# Patient Record
Sex: Male | Born: 1955 | Race: White | Hispanic: No | Marital: Married | Smoking: Never smoker
Health system: Southern US, Community
[De-identification: ages and names within clinical notes are randomized; demographics above are authoritative.]

## PROBLEM LIST (undated history)

## (undated) DIAGNOSIS — E119 Type 2 diabetes mellitus without complications: Secondary | ICD-10-CM

## (undated) DIAGNOSIS — G43909 Migraine, unspecified, not intractable, without status migrainosus: Secondary | ICD-10-CM

## (undated) DIAGNOSIS — D509 Iron deficiency anemia, unspecified: Secondary | ICD-10-CM

## (undated) DIAGNOSIS — E785 Hyperlipidemia, unspecified: Secondary | ICD-10-CM

## (undated) DIAGNOSIS — J45909 Unspecified asthma, uncomplicated: Secondary | ICD-10-CM

## (undated) DIAGNOSIS — Z87442 Personal history of urinary calculi: Secondary | ICD-10-CM

## (undated) DIAGNOSIS — I519 Heart disease, unspecified: Secondary | ICD-10-CM

## (undated) DIAGNOSIS — I10 Essential (primary) hypertension: Secondary | ICD-10-CM

## (undated) DIAGNOSIS — R0600 Dyspnea, unspecified: Secondary | ICD-10-CM

## (undated) DIAGNOSIS — M1990 Unspecified osteoarthritis, unspecified site: Secondary | ICD-10-CM

## (undated) DIAGNOSIS — R0609 Other forms of dyspnea: Secondary | ICD-10-CM

## (undated) DIAGNOSIS — E1142 Type 2 diabetes mellitus with diabetic polyneuropathy: Secondary | ICD-10-CM

## (undated) DIAGNOSIS — N400 Enlarged prostate without lower urinary tract symptoms: Secondary | ICD-10-CM

## (undated) DIAGNOSIS — E669 Obesity, unspecified: Secondary | ICD-10-CM

## (undated) DIAGNOSIS — K219 Gastro-esophageal reflux disease without esophagitis: Secondary | ICD-10-CM

## (undated) DIAGNOSIS — R609 Edema, unspecified: Secondary | ICD-10-CM

## (undated) DIAGNOSIS — G4733 Obstructive sleep apnea (adult) (pediatric): Secondary | ICD-10-CM

## (undated) HISTORY — PX: TOTAL HIP ARTHROPLASTY: SHX124

## (undated) HISTORY — PX: UPPER GI ENDOSCOPY: SHX6162

## (undated) HISTORY — PX: COLONOSCOPY: SHX174

## (undated) HISTORY — PX: TONSILLECTOMY AND ADENOIDECTOMY: SUR1326

## (undated) HISTORY — PX: TOTAL HIP REVISION: SHX763

---

## 1999-09-04 ENCOUNTER — Ambulatory Visit: Admission: RE | Admit: 1999-09-04 | Discharge: 1999-09-04 | Payer: Self-pay | Admitting: Family Medicine

## 1999-11-02 ENCOUNTER — Ambulatory Visit (HOSPITAL_BASED_OUTPATIENT_CLINIC_OR_DEPARTMENT_OTHER): Admission: RE | Admit: 1999-11-02 | Discharge: 1999-11-02 | Payer: Self-pay | Admitting: Family Medicine

## 2016-09-30 ENCOUNTER — Other Ambulatory Visit: Payer: Self-pay | Admitting: Urology

## 2016-10-11 ENCOUNTER — Encounter (HOSPITAL_COMMUNITY): Admission: RE | Payer: Self-pay | Source: Ambulatory Visit

## 2016-10-11 ENCOUNTER — Ambulatory Visit (HOSPITAL_COMMUNITY): Admission: RE | Admit: 2016-10-11 | Payer: 59 | Source: Ambulatory Visit | Admitting: Urology

## 2016-10-11 SURGERY — LITHOTRIPSY, ESWL
Anesthesia: LOCAL | Laterality: Right

## 2018-12-18 ENCOUNTER — Other Ambulatory Visit: Payer: Self-pay | Admitting: Orthopedic Surgery

## 2018-12-18 DIAGNOSIS — M25551 Pain in right hip: Secondary | ICD-10-CM

## 2018-12-28 ENCOUNTER — Other Ambulatory Visit: Payer: Self-pay

## 2018-12-28 ENCOUNTER — Ambulatory Visit
Admission: RE | Admit: 2018-12-28 | Discharge: 2018-12-28 | Disposition: A | Discharge status: Home / Self Care | Payer: 59 | Source: Ambulatory Visit | Attending: Orthopedic Surgery | Admitting: Orthopedic Surgery

## 2018-12-28 DIAGNOSIS — M25551 Pain in right hip: Secondary | ICD-10-CM

## 2019-04-10 DIAGNOSIS — Z8616 Personal history of COVID-19: Secondary | ICD-10-CM

## 2019-04-10 HISTORY — DX: Personal history of COVID-19: Z86.16

## 2019-06-07 ENCOUNTER — Inpatient Hospital Stay: Admit: 2019-06-07 | Payer: 59 | Admitting: Orthopedic Surgery

## 2019-06-07 SURGERY — TOTAL HIP REVISION
Anesthesia: Spinal | Site: Hip | Laterality: Right

## 2019-06-20 ENCOUNTER — Encounter (HOSPITAL_COMMUNITY): Payer: Self-pay

## 2019-06-20 NOTE — Patient Instructions (Addendum)
DUE TO COVID-19 ONLY ONE VISITOR IS ALLOWED TO COME WITH YOU AND STAY IN THE WAITING ROOM ONLY DURING PRE OP AND PROCEDURE. THE ONE VISITOR MAY VISIT WITH YOU IN YOUR PRIVATE ROOM DURING VISITING HOURS ONLY!!       Your procedure is scheduled on: Thursday, Feb. 25, 2021   Report to [facility] Main  Entrance   Report to Short Stay at 5:30 AM   Call this number if you have problems the morning of surgery [phone]   Bring CPAP mask and tubing day of surgery   Do not eat food :After Midnight.   May have liquids until 4:30 AM day of surgery   CLEAR LIQUID DIET  Foods Allowed                                                                     Foods Excluded  Water, Black Coffee and tea, regular and decaf                             liquids that you cannot  Plain Jell-O in any flavor  (No red)                                           see through such as: Fruit ices (not with fruit pulp)                                     milk, soups, orange juice  Iced Popsicles (No red)                                    All solid food Carbonated beverages, regular and diet                                    Apple juices Sports drinks like Gatorade (No red) Lightly seasoned clear broth or consume(fat free) Sugar, honey syrup  Sample Menu Breakfast                                Lunch                                     Supper Cranberry juice                    Beef broth                            Chicken broth Jell-O                                     Grape  juice                           Apple juice Coffee or tea                        Jell-O                                      Popsicle                                                Coffee or tea                        Coffee or tea   Complete one G2 drink the morning of surgery at 4:30AM the day of surgery.   Oral Hygiene is also important to reduce your risk of infection.                                    Remember - BRUSH  YOUR TEETH THE MORNING OF SURGERY WITH YOUR REGULAR TOOTHPASTE   Do NOT smoke after Midnight   DO NOT TAKE JARDIANCE THE DAY BEFORE SURGERY OR THE MORNING OF SURGERY    Take these medicines the morning of surgery with A SIP OF WATER: Carvedilol, Omeprazole  DO NOT TAKE ANY DIABETIC MEDICATIONS DAY OF YOUR SURGERY                      You may not have any metal on your body including jewelry, and body piercings             Do not wear lotions, powders, perfumes/cologne, or deodorant                         Men may shave face and neck.   Do not bring valuables to the hospital. [facility] IS NOT             RESPONSIBLE   FOR VALUABLES.   Contacts, dentures or bridgework may not be worn into surgery.   Bring small overnight bag day of surgery.    Special Instructions: Bring a copy of your healthcare power of attorney and living will documents         the day of surgery if you haven't scanned them in before.              Please read over the following fact sheets you were given:  How to Manage Your Diabetes Before and After Surgery  Why is it important to control my blood sugar before and after surgery? . Improving blood sugar levels before and after surgery helps healing and can limit problems. . A way of improving blood sugar control is eating a healthy diet by: o  Eating less sugar and carbohydrates o  Increasing activity/exercise o  Talking with your doctor about reaching your blood sugar goals . High blood sugars (greater than 180 mg/dL) can raise your risk of infections and slow your recovery, so you will need to focus on controlling your diabetes during the  weeks before surgery. . Make sure that the doctor who takes care of your diabetes knows about your planned surgery including the date and location.  How do I manage my blood sugar before surgery? . Check your blood sugar at least 4 times a day, starting 2 days before surgery, to make sure that the level is not too high or  low. o Check your blood sugar the morning of your surgery when you wake up and every 2 hours until you get to the Short Stay unit. . If your blood sugar is less than 70 mg/dL, you will need to treat for low blood sugar: o Do not take insulin. o Treat a low blood sugar (less than 70 mg/dL) with  cup of clear juice (cranberry or apple), 4 glucose tablets, OR glucose gel. o Recheck blood sugar in 15 minutes after treatment (to make sure it is greater than 70 mg/dL). If your blood sugar is not greater than 70 mg/dL on recheck, call [phone] for further instructions. . Report your blood sugar to the short stay nurse when you get to Short Stay.  . If you are admitted to the hospital after surgery: o Your blood sugar will be checked by the staff and you will probably be given insulin after surgery (instead of oral diabetes medicines) to make sure you have good blood sugar levels. o The goal for blood sugar control after surgery is 80-180 mg/dL.   WHAT DO I DO ABOUT MY DIABETES MEDICATION?   THE DAY BEFORE SURGERY DO NOT TAKE JARDIANCE  . Do not take oral diabetes medicines (pills) the morning of surgery.    Reviewed and Endorsed by [facility] Patient Education Committee, August 2015  [facility] - Preparing for Surgery Before surgery, you can play an important role.  Because skin is not sterile, your skin needs to be as free of germs as possible.  You can reduce the number of germs on your skin by washing with CHG (chlorahexidine gluconate) soap before surgery.  CHG is an antiseptic cleaner which kills germs and bonds with the skin to continue killing germs even after washing. Please DO NOT use if you have an allergy to CHG or antibacterial soaps.  If your skin becomes reddened/irritated stop using the CHG and inform your nurse when you arrive at Short Stay. Do not shave (including legs and underarms) for at least 48 hours prior to the first CHG shower.  You may shave your  face/neck.  Please follow these instructions carefully:  1.  Shower with CHG Soap the night before surgery and the  morning of surgery.  2.  If you choose to wash your hair, wash your hair first as usual with your normal  shampoo.  3.  After you shampoo, rinse your hair and body thoroughly to remove the shampoo.                             4.  Use CHG as you would any other liquid soap.  You can apply chg directly to the skin and wash.  Gently with a scrungie or clean washcloth.  5.  Apply the CHG Soap to your body ONLY FROM THE NECK DOWN.   Do   not use on face/ open                           Wound or open sores. Avoid contact  with eyes, ears mouth and   genitals (private parts).                       Wash face,  Genitals (private parts) with your normal soap.             6.  Wash thoroughly, paying special attention to the area where your    surgery  will be performed.  7.  Thoroughly rinse your body with warm water from the neck down.  8.  DO NOT shower/wash with your normal soap after using and rinsing off the CHG Soap.                9.  Pat yourself dry with a clean towel.            10.  Wear clean pajamas.            11.  Place clean sheets on your bed the night of your first shower and do not  sleep with pets. Day of Surgery : Do not apply any lotions/deodorants the morning of surgery.  Please wear clean clothes to the hospital/surgery center.  FAILURE TO FOLLOW THESE INSTRUCTIONS MAY RESULT IN THE CANCELLATION OF YOUR SURGERY  PATIENT SIGNATURE_________________________________  NURSE SIGNATURE__________________________________  ________________________________________________________________________   Rogelia MireIncentive Spirometer  An incentive spirometer is a tool that can help keep your lungs clear and active. This tool measures how well you are filling your lungs with each breath. Taking long deep breaths may help reverse or decrease the chance of developing breathing (pulmonary)  problems (especially infection) following:  A long period of time when you are unable to move or be active. BEFORE THE PROCEDURE   If the spirometer includes an indicator to show your best effort, your nurse or respiratory therapist will set it to a desired goal.  If possible, sit up straight or lean slightly forward. Try not to slouch.  Hold the incentive spirometer in an upright position. INSTRUCTIONS FOR USE  1. Sit on the edge of your bed if possible, or sit up as far as you can in bed or on a chair. 2. Hold the incentive spirometer in an upright position. 3. Breathe out normally. 4. Place the mouthpiece in your mouth and seal your lips tightly around it. 5. Breathe in slowly and as deeply as possible, raising the piston or the ball toward the top of the column. 6. Hold your breath for 3-5 seconds or for as long as possible. Allow the piston or ball to fall to the bottom of the column. 7. Remove the mouthpiece from your mouth and breathe out normally. 8. Rest for a few seconds and repeat Steps 1 through 7 at least 10 times every 1-2 hours when you are awake. Take your time and take a few normal breaths between deep breaths. 9. The spirometer may include an indicator to show your best effort. Use the indicator as a goal to work toward during each repetition. 10. After each set of 10 deep breaths, practice coughing to be sure your lungs are clear. If you have an incision (the cut made at the time of surgery), support your incision when coughing by placing a pillow or rolled up towels firmly against it. Once you are able to get out of bed, walk around indoors and cough well. You may stop using the incentive spirometer when instructed by your caregiver.  RISKS AND COMPLICATIONS  Take your time so you do not get dizzy  or light-headed.  If you are in pain, you may need to take or ask for pain medication before doing incentive spirometry. It is harder to take a deep breath if you are having  pain. AFTER USE  Rest and breathe slowly and easily.  It can be helpful to keep track of a log of your progress. Your caregiver can provide you with a simple table to help with this. If you are using the spirometer at home, follow these instructions: SEEK MEDICAL CARE IF:   You are having difficultly using the spirometer.  You have trouble using the spirometer as often as instructed.  Your pain medication is not giving enough relief while using the spirometer.  You develop fever of 100.5 F (38.1 C) or higher. SEEK IMMEDIATE MEDICAL CARE IF:   You cough up bloody sputum that had not been present before.  You develop fever of 102 F (38.9 C) or greater.  You develop worsening pain at or near the incision site. MAKE SURE YOU:   Understand these instructions.  Will watch your condition.  Will get help right away if you are not doing well or get worse. Document Released: 09/06/2006 Document Revised: 07/19/2011 Document Reviewed: 11/07/2006 Exit[employer] Patient Information 2014 ExitCare, [place].   ________________________________________________________________________  WHAT IS A BLOOD TRANSFUSION? Blood Transfusion Information  A transfusion is the replacement of blood or some of its parts. Blood is made up of multiple cells which provide different functions.  Red blood cells carry oxygen and are used for blood loss replacement.  White blood cells fight against infection.  Platelets control bleeding.  Plasma helps clot blood.  Other blood products are available for specialized needs, such as hemophilia or other clotting disorders. BEFORE THE TRANSFUSION  Who gives blood for transfusions?   Healthy volunteers who are fully evaluated to make sure their blood is safe. This is blood bank blood. Transfusion therapy is the safest it has ever been in the practice of medicine. Before blood is taken from a donor, a complete history is taken to make sure that person has no history  of diseases nor engages in risky social behavior (examples are intravenous drug use or sexual activity with multiple partners). The donor's travel history is screened to minimize risk of transmitting infections, such as malaria. The donated blood is tested for signs of infectious diseases, such as HIV and hepatitis. The blood is then tested to be sure it is compatible with you in order to minimize the chance of a transfusion reaction. If you or a relative donates blood, this is often done in anticipation of surgery and is not appropriate for emergency situations. It takes many days to process the donated blood. RISKS AND COMPLICATIONS Although transfusion therapy is very safe and saves many lives, the main dangers of transfusion include:   Getting an infectious disease.  Developing a transfusion reaction. This is an allergic reaction to something in the blood you were given. Every precaution is taken to prevent this. The decision to have a blood transfusion has been considered carefully by your caregiver before blood is given. Blood is not given unless the benefits outweigh the risks. AFTER THE TRANSFUSION  Right after receiving a blood transfusion, you will usually feel much better and more energetic. This is especially true if your red blood cells have gotten low (anemic). The transfusion raises the level of the red blood cells which carry oxygen, and this usually causes an energy increase.  The nurse administering the transfusion will monitor  you carefully for complications. HOME CARE INSTRUCTIONS  No special instructions are needed after a transfusion. You may find your energy is better. Speak with your caregiver about any limitations on activity for underlying diseases you may have. SEEK MEDICAL CARE IF:   Your condition is not improving after your transfusion.  You develop redness or irritation at the intravenous (IV) site. SEEK IMMEDIATE MEDICAL CARE IF:  Any of the following symptoms  occur over the next 12 hours:  Shaking chills.  You have a temperature by mouth above 102 F (38.9 C), not controlled by medicine.  Chest, back, or muscle pain.  People around you feel you are not acting correctly or are confused.  Shortness of breath or difficulty breathing.  Dizziness and fainting.  You get a rash or develop hives.  You have a decrease in urine output.  Your urine turns a dark color or changes to pink, red, or brown. Any of the following symptoms occur over the next 10 days:  You have a temperature by mouth above 102 F (38.9 C), not controlled by medicine.  Shortness of breath.  Weakness after normal activity.  The white part of the eye turns yellow (jaundice).  You have a decrease in the amount of urine or are urinating less often.  Your urine turns a dark color or changes to pink, red, or brown. Document Released: 04/23/2000 Document Revised: 07/19/2011 Document Reviewed: 12/11/2007 [facility] Patient Information 2014 [place], [place].  _______________________________________________________________________

## 2019-06-25 ENCOUNTER — Encounter (HOSPITAL_COMMUNITY)
Admission: RE | Admit: 2019-06-25 | Discharge: 2019-06-25 | Disposition: A | Discharge status: Home / Self Care | Payer: BC Managed Care – PPO | Source: Ambulatory Visit | Attending: Orthopedic Surgery | Admitting: Orthopedic Surgery

## 2019-06-25 ENCOUNTER — Other Ambulatory Visit: Payer: Self-pay

## 2019-06-25 ENCOUNTER — Encounter (HOSPITAL_COMMUNITY): Payer: Self-pay

## 2019-06-25 HISTORY — DX: Other forms of dyspnea: R06.09

## 2019-06-25 HISTORY — DX: Unspecified osteoarthritis, unspecified site: M19.90

## 2019-06-25 HISTORY — DX: Heart disease, unspecified: I51.9

## 2019-06-25 HISTORY — DX: Iron deficiency anemia, unspecified: D50.9

## 2019-06-25 HISTORY — DX: Dyspnea, unspecified: R06.00

## 2019-06-25 HISTORY — DX: Gastro-esophageal reflux disease without esophagitis: K21.9

## 2019-06-25 HISTORY — DX: Migraine, unspecified, not intractable, without status migrainosus: G43.909

## 2019-06-25 HISTORY — DX: Type 2 diabetes mellitus with diabetic polyneuropathy: E11.42

## 2019-06-25 HISTORY — DX: Benign prostatic hyperplasia without lower urinary tract symptoms: N40.0

## 2019-06-25 HISTORY — DX: Edema, unspecified: R60.9

## 2019-06-25 HISTORY — DX: Essential (primary) hypertension: I10

## 2019-06-25 HISTORY — DX: Unspecified asthma, uncomplicated: J45.909

## 2019-06-25 HISTORY — DX: Type 2 diabetes mellitus without complications: E11.9

## 2019-06-25 HISTORY — DX: Obesity, unspecified: E66.9

## 2019-06-25 HISTORY — DX: Personal history of urinary calculi: Z87.442

## 2019-06-25 HISTORY — DX: Hyperlipidemia, unspecified: E78.5

## 2019-06-25 HISTORY — DX: Obstructive sleep apnea (adult) (pediatric): G47.33

## 2019-06-25 NOTE — Progress Notes (Signed)
PCP - Dr. Martha Clan last office visit 06/06/19 Cardiologist - Dr. Cammy Brochure 12/20  Chest x-ray - greater than 1 year EKG - 06/27/19 in epic Stress Test - greater than 2 years ECHO - 12/2017 in epic Cardiac Cath - greater than 2 years  Sleep Study - 2019 CPAP - Yes  Fasting Blood Sugar -  120-140 Checks Blood Sugar __2___ times a week  Blood Thinner Instructions: N/A Aspirin Instructions: Yes Last Dose: Patient stated he will stop 5 days prior to surgery  Anesthesia review: DM, Right heart ventricular dysfunction  Patient denies shortness of breath, fever, cough and chest pain at PAT appointment   Patient verbalized understanding of instructions that were given to them at the PAT appointment. Patient was also instructed that they will need to review over the PAT instructions again at home before surgery.

## 2019-06-27 ENCOUNTER — Other Ambulatory Visit: Payer: Self-pay

## 2019-06-27 ENCOUNTER — Encounter (HOSPITAL_COMMUNITY)
Admission: RE | Admit: 2019-06-27 | Discharge: 2019-06-27 | Disposition: A | Discharge status: Home / Self Care | Payer: BC Managed Care – PPO | Source: Ambulatory Visit | Attending: Orthopedic Surgery | Admitting: Orthopedic Surgery

## 2019-06-27 DIAGNOSIS — Z01818 Encounter for other preprocedural examination: Secondary | ICD-10-CM | POA: Insufficient documentation

## 2019-06-27 LAB — COMPREHENSIVE METABOLIC PANEL
ALT: 24 U/L (ref 0–44)
AST: 24 U/L (ref 15–41)
Albumin: 3.8 g/dL (ref 3.5–5.0)
Alkaline Phosphatase: 84 U/L (ref 38–126)
Anion gap: 11 (ref 5–15)
BUN: 16 mg/dL (ref 8–23)
CO2: 25 mmol/L (ref 22–32)
Calcium: 9.5 mg/dL (ref 8.9–10.3)
Chloride: 105 mmol/L (ref 98–111)
Creatinine, Ser: 1.06 mg/dL (ref 0.61–1.24)
GFR calc Af Amer: >60 mL/min (ref >60)
GFR calc non Af Amer: >60 mL/min (ref >60)
Glucose, Bld: 111 mg/dL — ABNORMAL HIGH (ref 70–99)
Potassium: 4.1 mmol/L (ref 3.5–5.1)
Sodium: 141 mmol/L (ref 135–145)
Total Bilirubin: 0.7 mg/dL (ref 0.3–1.2)
Total Protein: 7.2 g/dL (ref 6.5–8.1)

## 2019-06-27 LAB — CBC
HCT: 45.5 % (ref 39.0–52.0)
Hemoglobin: 14.7 g/dL (ref 13.0–17.0)
MCH: 28.9 pg (ref 26.0–34.0)
MCHC: 32.3 g/dL (ref 30.0–36.0)
MCV: 89.6 fL (ref 80.0–100.0)
Platelets: 183 10*3/uL (ref 150–400)
RBC: 5.08 MIL/uL (ref 4.22–5.81)
RDW: 14.9 % (ref 11.5–15.5)
WBC: 5.6 10*3/uL (ref 4.0–10.5)
nRBC: 0 % (ref 0.0–0.2)

## 2019-06-27 LAB — GLUCOSE, CAPILLARY: Glucose-Capillary: 110 mg/dL — ABNORMAL HIGH (ref 70–99)

## 2019-06-27 LAB — PROTIME-INR
INR: 1 (ref 0.8–1.2)
Prothrombin Time: 13 seconds (ref 11.4–15.2)

## 2019-06-27 LAB — ABO/RH: ABO/RH(D): O POS

## 2019-06-27 LAB — HEMOGLOBIN A1C
Hgb A1c MFr Bld: 6.6 % — ABNORMAL HIGH (ref 4.8–5.6)
Mean Plasma Glucose: 142.72 mg/dL

## 2019-06-28 NOTE — Progress Notes (Addendum)
Anesthesia Chart Review   Case: [id number] Date/Time: 07/05/19 0715   Procedure: TOTAL HIP REVISION (Right Hip) - 90 mins   Anesthesia type: Spinal   Pre-op diagnosis: Failed right total hip   Location: [facility] ROOM 09 / [facility]   Surgeons: Durene Romans, MD      DISCUSSION:63 y.o. 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 Durene Romans.   Pt last seen by cardiologist, 04/13/2019.  Per OV note, "HTN well controlled.  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  Repeat TTE 12/2017 showed normalization and RV function.  Continue to monitor for s/sx of HF.  HL well controlled with statin." 1 year follow up recommended.    Anticipate pt can proceed with planned procedure barring acute status change.   VS: BP 119/82 (BP Location: Right Arm)   Pulse 63   Temp 36.8 C (Oral)   Resp 18   Ht 5\' 11"  (1.803 m)   Wt 96.9 kg   SpO2 97%   BMI 29.79 kg/m   PROVIDERS: Patient, No Pcp Per  , MD is PCP last seen 06/06/19, stable at this visit.   Pu, 06/08/19, MD is Cardiologist  LABS: Labs reviewed: Acceptable for surgery. (all labs ordered are listed, but only abnormal results are displayed)  Labs Reviewed  COMPREHENSIVE METABOLIC PANEL - Abnormal; Notable for the following components:      Result Value   Glucose, Bld 111 (*)    All other components within normal limits  HEMOGLOBIN A1C - Abnormal; Notable for the following components:   Hgb A1c MFr Bld 6.6 (*)    All other components within normal limits  GLUCOSE, CAPILLARY - Abnormal; Notable for the following components:   Glucose-Capillary 110 (*)    All other components within normal limits  SURGICAL PCR SCREEN  CBC  PROTIME-INR  TYPE AND SCREEN  ABO/RH     IMAGES:   EKG: 06/27/19 Rate 59 bpm Sinus bradycardia Left axis deviation Possible Anterior infarct , age  undetermined Abnormal ECG No previous tracing  CV: 2D Limited Echo 12/08/17 SUMMARY Limited study The right ventricle is mild to moderately dilated. The right ventricular systolic function is normal. The right atrium is mildly dilated. There was insufficient TR detected to calculate RV systolic pressure. Estimated right atrial pressure is 5 mmHg.02/07/18 There is no pericardial effusion.  Echo 08/16/2017 Left Ventricular EF 60 %   SUMMARY Ammended report to reflect RV dysfunction. The left ventricular size is normal. Mild left ventricular hypertrophy  Left ventricular systolic function is normal. No segmental wall motion abnormalities seen in the left ventricle The right ventricle is moderately to severely dilated with moderate global  hypokinesis and with trabeculation. The left atrium is borderline dilated.  Right atrial size is normal. There is aortic valve sclerosis. Borderline aortic root dilatation. There is no pericardial effusion. In comparison to images of exam from 06/24/11 RV dilation and dysfunction have  worsened significantly. Past Medical History:  Diagnosis Date  . Arthritis   . Asthma   . BPH (benign prostatic hyperplasia)    not currently having issues  . Diabetes mellitus without complication (HCC)   . DM polyneuropathy (HCC)   . DOE (dyspnea on exertion)    Chronic  . Edema   . GERD (gastroesophageal reflux disease)   . History of COVID-19 04/2019   positive results noted in care  everywhere  . History of kidney stones   . Hyperlipidemia   . Hypertension   . IDA (iron deficiency anemia)   . Microcytic hypochromic anemia   . Migraine   . Obese   . OSA on CPAP   . Right ventricular dysfunction    Idiopathic RV systolic dysfunction, right side enlargement    Past Surgical History:  Procedure Laterality Date  . COLONOSCOPY    . TONSILLECTOMY AND ADENOIDECTOMY    . TOTAL HIP ARTHROPLASTY Right   . TOTAL HIP REVISION Right   . UPPER GI ENDOSCOPY       MEDICATIONS: . Alpha-Lipoic Acid 200 MG CAPS  . aspirin EC 81 MG tablet  . atorvastatin (LIPITOR) 20 MG tablet  . Boswellia Serrata (BOSWELLIA PO)  . carvedilol (COREG) 6.25 MG tablet  . empagliflozin (JARDIANCE) 10 MG TABS tablet  . ferrous sulfate 325 (65 FE) MG tablet  . folic acid (FOLVITE) 983 MCG tablet  . furosemide (LASIX) 20 MG tablet  . Galcanezumab-gnlm (EMGALITY) 120 MG/ML SOAJ  . metFORMIN (GLUCOPHAGE) 1000 MG tablet  . montelukast (SINGULAIR) 10 MG tablet  . Multiple Vitamin (MULTIVITAMIN WITH MINERALS) TABS tablet  . omeprazole (PRILOSEC) 20 MG capsule  . valsartan (DIOVAN) 320 MG tablet   No current facility-administered medications for this encounter.   Maia Plan [facility] Pre-Surgical Testing [phone] 06/28/19  11:50 AM

## 2019-07-02 ENCOUNTER — Inpatient Hospital Stay (HOSPITAL_COMMUNITY): Admission: RE | Admit: 2019-07-02 | Payer: BC Managed Care – PPO | Source: Ambulatory Visit

## 2019-07-02 NOTE — Progress Notes (Signed)
Patient was positive for COVID on 05/07/19, his results are with in the 90 day window of no retesting required.  Patients results are in care everywhere

## 2019-07-04 NOTE — Anesthesia Preprocedure Evaluation (Addendum)
Anesthesia Evaluation    Reviewed: Allergy & Precautions, Patient's Chart, lab work & pertinent test results  History of Anesthesia Complications Negative for: history of anesthetic complications  Airway Mallampati: II  TM Distance: >3 FB Neck ROM: Full    Dental  (+) Teeth Intact, Dental Advisory Given   Pulmonary asthma , sleep apnea and Continuous Positive Airway Pressure Ventilation ,    Pulmonary exam normal breath sounds clear to auscultation       Cardiovascular hypertension, Pt. on home beta blockers and Pt. on medications + DOE  Normal cardiovascular exam Rhythm:Regular Rate:Normal     Neuro/Psych  Headaches,  Neuromuscular disease negative psych ROS   GI/Hepatic Neg liver ROS, GERD  Medicated and Controlled,  Endo/Other  diabetes, Type 2, Oral Hypoglycemic Agents  Renal/GU negative Renal ROS     Musculoskeletal  (+) Arthritis , Failed right total hip   Abdominal   Peds  Hematology negative hematology ROS (+) Plt 183k   Anesthesia Other Findings Day of surgery medications reviewed with the patient.  Reproductive/Obstetrics                            Anesthesia Physical Anesthesia Plan  ASA: III  Anesthesia Plan: Spinal   Post-op Pain Management:    Induction: Intravenous  PONV Risk Score and Plan: 2 and Propofol infusion, Midazolam and Ondansetron  Airway Management Planned: Natural Airway and Nasal Cannula  Additional Equipment:   Intra-op Plan:   Post-operative Plan:   Informed Consent: I have reviewed the patients History and Physical, chart, labs and discussed the procedure including the risks, benefits and alternatives for the proposed anesthesia with the patient or authorized representative who has indicated his/her understanding and acceptance.     Dental advisory given  Plan Discussed with: CRNA  Anesthesia Plan Comments:        Anesthesia Quick  Evaluation

## 2019-07-05 ENCOUNTER — Inpatient Hospital Stay (HOSPITAL_COMMUNITY): Payer: BC Managed Care – PPO

## 2019-07-05 ENCOUNTER — Inpatient Hospital Stay (HOSPITAL_COMMUNITY)
Admission: RE | Admit: 2019-07-05 | Discharge: 2019-07-06 | DRG: 468 | Disposition: A | Discharge status: Home / Self Care | Payer: BC Managed Care – PPO | Attending: Orthopedic Surgery | Admitting: Orthopedic Surgery

## 2019-07-05 ENCOUNTER — Inpatient Hospital Stay (HOSPITAL_COMMUNITY): Payer: BC Managed Care – PPO | Admitting: Physician Assistant

## 2019-07-05 ENCOUNTER — Other Ambulatory Visit: Payer: Self-pay

## 2019-07-05 ENCOUNTER — Encounter (HOSPITAL_COMMUNITY)
Admission: RE | Disposition: A | Discharge status: Home / Self Care | Payer: Self-pay | Source: Home / Self Care | Attending: Orthopedic Surgery

## 2019-07-05 ENCOUNTER — Inpatient Hospital Stay (HOSPITAL_COMMUNITY): Payer: BC Managed Care – PPO | Admitting: Anesthesiology

## 2019-07-05 ENCOUNTER — Encounter (HOSPITAL_COMMUNITY): Payer: Self-pay | Admitting: Orthopedic Surgery

## 2019-07-05 DIAGNOSIS — Z87442 Personal history of urinary calculi: Secondary | ICD-10-CM

## 2019-07-05 DIAGNOSIS — Z888 Allergy status to other drugs, medicaments and biological substances status: Secondary | ICD-10-CM

## 2019-07-05 DIAGNOSIS — G4733 Obstructive sleep apnea (adult) (pediatric): Secondary | ICD-10-CM | POA: Diagnosis present

## 2019-07-05 DIAGNOSIS — Z79899 Other long term (current) drug therapy: Secondary | ICD-10-CM

## 2019-07-05 DIAGNOSIS — T84060A Wear of articular bearing surface of internal prosthetic right hip joint, initial encounter: Secondary | ICD-10-CM | POA: Diagnosis present

## 2019-07-05 DIAGNOSIS — E1142 Type 2 diabetes mellitus with diabetic polyneuropathy: Secondary | ICD-10-CM | POA: Diagnosis present

## 2019-07-05 DIAGNOSIS — I10 Essential (primary) hypertension: Secondary | ICD-10-CM | POA: Diagnosis present

## 2019-07-05 DIAGNOSIS — E785 Hyperlipidemia, unspecified: Secondary | ICD-10-CM | POA: Diagnosis present

## 2019-07-05 DIAGNOSIS — M25551 Pain in right hip: Secondary | ICD-10-CM | POA: Diagnosis present

## 2019-07-05 DIAGNOSIS — N400 Enlarged prostate without lower urinary tract symptoms: Secondary | ICD-10-CM | POA: Diagnosis present

## 2019-07-05 DIAGNOSIS — M1611 Unilateral primary osteoarthritis, right hip: Secondary | ICD-10-CM | POA: Diagnosis present

## 2019-07-05 DIAGNOSIS — Z7984 Long term (current) use of oral hypoglycemic drugs: Secondary | ICD-10-CM | POA: Diagnosis not present

## 2019-07-05 DIAGNOSIS — T84050A Periprosthetic osteolysis of internal prosthetic right hip joint, initial encounter: Principal | ICD-10-CM | POA: Diagnosis present

## 2019-07-05 DIAGNOSIS — K219 Gastro-esophageal reflux disease without esophagitis: Secondary | ICD-10-CM | POA: Diagnosis present

## 2019-07-05 DIAGNOSIS — Z7982 Long term (current) use of aspirin: Secondary | ICD-10-CM

## 2019-07-05 DIAGNOSIS — J45909 Unspecified asthma, uncomplicated: Secondary | ICD-10-CM | POA: Diagnosis present

## 2019-07-05 DIAGNOSIS — Z8616 Personal history of COVID-19: Secondary | ICD-10-CM

## 2019-07-05 DIAGNOSIS — Y792 Prosthetic and other implants, materials and accessory orthopedic devices associated with adverse incidents: Secondary | ICD-10-CM | POA: Diagnosis present

## 2019-07-05 DIAGNOSIS — E669 Obesity, unspecified: Secondary | ICD-10-CM | POA: Diagnosis present

## 2019-07-05 DIAGNOSIS — Z6830 Body mass index (BMI) 30.0-30.9, adult: Secondary | ICD-10-CM | POA: Diagnosis not present

## 2019-07-05 DIAGNOSIS — Z96649 Presence of unspecified artificial hip joint: Secondary | ICD-10-CM

## 2019-07-05 DIAGNOSIS — Y838 Other surgical procedures as the cause of abnormal reaction of the patient, or of later complication, without mention of misadventure at the time of the procedure: Secondary | ICD-10-CM | POA: Diagnosis present

## 2019-07-05 DIAGNOSIS — E663 Overweight: Secondary | ICD-10-CM | POA: Diagnosis present

## 2019-07-05 HISTORY — PX: TOTAL HIP REVISION: SHX763

## 2019-07-05 LAB — GLUCOSE, CAPILLARY
Glucose-Capillary: 154 mg/dL — ABNORMAL HIGH (ref 70–99)
Glucose-Capillary: 126 mg/dL — ABNORMAL HIGH (ref 70–99)
Glucose-Capillary: 187 mg/dL — ABNORMAL HIGH (ref 70–99)
Glucose-Capillary: 190 mg/dL — ABNORMAL HIGH (ref 70–99)

## 2019-07-05 SURGERY — TOTAL HIP REVISION
Anesthesia: Spinal | Site: Hip | Laterality: Right

## 2019-07-05 MED ORDER — STERILE WATER FOR IRRIGATION IR SOLN
Status: DC | PRN
  Administered 2019-07-05: 2000 mL

## 2019-07-05 MED ORDER — METOCLOPRAMIDE HCL 5 MG/ML IJ SOLN: 5.0000 mg | Freq: Three times a day (TID) | INTRAMUSCULAR | Status: DC | PRN

## 2019-07-05 MED ORDER — LACTATED RINGERS IV SOLN: INTRAVENOUS | Status: DC

## 2019-07-05 MED ORDER — PROMETHAZINE HCL 25 MG/ML IJ SOLN: 6.2500 mg | INTRAMUSCULAR | Status: DC | PRN

## 2019-07-05 MED ORDER — FERROUS SULFATE 325 (65 FE) MG PO TABS
325.0000 mg | ORAL_TABLET | Freq: Three times a day (TID) | ORAL | Status: DC
  Administered 2019-07-05 – 2019-07-06 (×2): 325 mg via ORAL
  Filled 2019-07-05 (×2): qty 1

## 2019-07-05 MED ORDER — CHLORHEXIDINE GLUCONATE 4 % EX LIQD: 60.0000 mL | Freq: Once | CUTANEOUS | Status: DC

## 2019-07-05 MED ORDER — DEXAMETHASONE SODIUM PHOSPHATE 10 MG/ML IJ SOLN
10.0000 mg | Freq: Once | INTRAMUSCULAR | Status: AC
  Administered 2019-07-05: 10 mg via INTRAVENOUS

## 2019-07-05 MED ORDER — PHENYLEPHRINE 40 MCG/ML (10ML) SYRINGE FOR IV PUSH (FOR BLOOD PRESSURE SUPPORT)
PREFILLED_SYRINGE | INTRAVENOUS | Status: AC
  Filled 2019-07-05: qty 10

## 2019-07-05 MED ORDER — ONDANSETRON HCL 4 MG PO TABS: 4.0000 mg | ORAL_TABLET | Freq: Four times a day (QID) | ORAL | Status: DC | PRN

## 2019-07-05 MED ORDER — ONDANSETRON HCL 4 MG/2ML IJ SOLN
INTRAMUSCULAR | Status: AC
  Filled 2019-07-05: qty 2

## 2019-07-05 MED ORDER — METHOCARBAMOL 500 MG PO TABS
500.0000 mg | ORAL_TABLET | Freq: Four times a day (QID) | ORAL | Status: DC | PRN
  Administered 2019-07-06: 09:00:00 500 mg via ORAL
  Filled 2019-07-05 (×2): qty 1

## 2019-07-05 MED ORDER — TRANEXAMIC ACID-NACL 1000-0.7 MG/100ML-% IV SOLN
1000.0000 mg | INTRAVENOUS | Status: AC
  Administered 2019-07-05: 1000 mg via INTRAVENOUS
  Filled 2019-07-05: qty 100

## 2019-07-05 MED ORDER — POVIDONE-IODINE 10 % EX SWAB
2.0000 "application " | Freq: Once | CUTANEOUS | Status: AC
  Administered 2019-07-05: 2 via TOPICAL

## 2019-07-05 MED ORDER — SODIUM CHLORIDE 0.9 % IR SOLN
Status: DC | PRN
  Administered 2019-07-05: 1000 mL

## 2019-07-05 MED ORDER — IRBESARTAN 150 MG PO TABS
300.0000 mg | ORAL_TABLET | Freq: Every day | ORAL | Status: DC
  Administered 2019-07-05 – 2019-07-06 (×2): 300 mg via ORAL
  Filled 2019-07-05 (×2): qty 2

## 2019-07-05 MED ORDER — MIDAZOLAM HCL 2 MG/2ML IJ SOLN
INTRAMUSCULAR | Status: DC | PRN
  Administered 2019-07-05: 2 mg via INTRAVENOUS

## 2019-07-05 MED ORDER — POLYETHYLENE GLYCOL 3350 17 G PO PACK
17.0000 g | PACK | Freq: Two times a day (BID) | ORAL | Status: DC
  Administered 2019-07-05: 21:00:00 17 g via ORAL
  Filled 2019-07-05 (×2): qty 1

## 2019-07-05 MED ORDER — DEXAMETHASONE SODIUM PHOSPHATE 10 MG/ML IJ SOLN
INTRAMUSCULAR | Status: AC
  Filled 2019-07-05: qty 1

## 2019-07-05 MED ORDER — METFORMIN HCL 500 MG PO TABS
1000.0000 mg | ORAL_TABLET | Freq: Two times a day (BID) | ORAL | Status: DC
  Administered 2019-07-05 – 2019-07-06 (×2): 1000 mg via ORAL
  Filled 2019-07-05 (×2): qty 2

## 2019-07-05 MED ORDER — NON FORMULARY: 20.0000 mg | Freq: Two times a day (BID) | Status: DC

## 2019-07-05 MED ORDER — MAGNESIUM CITRATE PO SOLN: 1.0000 | Freq: Once | ORAL | Status: DC | PRN

## 2019-07-05 MED ORDER — INSULIN ASPART 100 UNIT/ML ~~LOC~~ SOLN
0.0000 [IU] | Freq: Three times a day (TID) | SUBCUTANEOUS | Status: DC
  Administered 2019-07-05: 3 [IU] via SUBCUTANEOUS

## 2019-07-05 MED ORDER — BISACODYL 10 MG RE SUPP: 10.0000 mg | Freq: Every day | RECTAL | Status: DC | PRN

## 2019-07-05 MED ORDER — METHOCARBAMOL 500 MG IVPB - SIMPLE MED
INTRAVENOUS | Status: AC
  Filled 2019-07-05: qty 50

## 2019-07-05 MED ORDER — ATORVASTATIN CALCIUM 20 MG PO TABS
20.0000 mg | ORAL_TABLET | Freq: Every day | ORAL | Status: DC
  Administered 2019-07-05 – 2019-07-06 (×2): 20 mg via ORAL
  Filled 2019-07-05 (×2): qty 1

## 2019-07-05 MED ORDER — DIPHENHYDRAMINE HCL 12.5 MG/5ML PO ELIX: 12.5000 mg | ORAL_SOLUTION | ORAL | Status: DC | PRN

## 2019-07-05 MED ORDER — SODIUM CHLORIDE 0.9 % IV SOLN: INTRAVENOUS | Status: DC

## 2019-07-05 MED ORDER — ONDANSETRON HCL 4 MG/2ML IJ SOLN
INTRAMUSCULAR | Status: DC | PRN
  Administered 2019-07-05: 4 mg via INTRAVENOUS

## 2019-07-05 MED ORDER — HYDROCODONE-ACETAMINOPHEN 7.5-325 MG PO TABS: 1.0000 | ORAL_TABLET | ORAL | Status: DC | PRN

## 2019-07-05 MED ORDER — PHENYLEPHRINE 40 MCG/ML (10ML) SYRINGE FOR IV PUSH (FOR BLOOD PRESSURE SUPPORT)
PREFILLED_SYRINGE | INTRAVENOUS | Status: DC | PRN
  Administered 2019-07-05: 80 ug via INTRAVENOUS
  Administered 2019-07-05 (×2): 120 ug via INTRAVENOUS
  Administered 2019-07-05: 80 ug via INTRAVENOUS

## 2019-07-05 MED ORDER — MIDAZOLAM HCL 2 MG/2ML IJ SOLN
INTRAMUSCULAR | Status: AC
  Filled 2019-07-05: qty 2

## 2019-07-05 MED ORDER — PHENYLEPHRINE HCL-NACL 10-0.9 MG/250ML-% IV SOLN
INTRAVENOUS | Status: DC | PRN
  Administered 2019-07-05: 50 ug/min via INTRAVENOUS

## 2019-07-05 MED ORDER — HYDROMORPHONE HCL 1 MG/ML IJ SOLN: 0.5000 mg | INTRAMUSCULAR | Status: DC | PRN

## 2019-07-05 MED ORDER — ALUM & MAG HYDROXIDE-SIMETH 200-200-20 MG/5ML PO SUSP: 15.0000 mL | ORAL | Status: DC | PRN

## 2019-07-05 MED ORDER — CEFAZOLIN SODIUM-DEXTROSE 2-4 GM/100ML-% IV SOLN
2.0000 g | INTRAVENOUS | Status: AC
  Administered 2019-07-05: 2 g via INTRAVENOUS
  Filled 2019-07-05: qty 100

## 2019-07-05 MED ORDER — HYDROCODONE-ACETAMINOPHEN 5-325 MG PO TABS
1.0000 | ORAL_TABLET | ORAL | Status: DC | PRN
  Administered 2019-07-05 – 2019-07-06 (×3): 2 via ORAL
  Filled 2019-07-05 (×3): qty 2

## 2019-07-05 MED ORDER — PROPOFOL 10 MG/ML IV BOLUS
INTRAVENOUS | Status: DC | PRN
  Administered 2019-07-05: 30 mg via INTRAVENOUS

## 2019-07-05 MED ORDER — PROPOFOL 500 MG/50ML IV EMUL
INTRAVENOUS | Status: DC | PRN
  Administered 2019-07-05: 100 ug/kg/min via INTRAVENOUS

## 2019-07-05 MED ORDER — FUROSEMIDE 20 MG PO TABS
20.0000 mg | ORAL_TABLET | Freq: Every day | ORAL | Status: DC
  Administered 2019-07-05 – 2019-07-06 (×2): 20 mg via ORAL
  Filled 2019-07-05 (×2): qty 1

## 2019-07-05 MED ORDER — METHOCARBAMOL 500 MG IVPB - SIMPLE MED
500.0000 mg | Freq: Four times a day (QID) | INTRAVENOUS | Status: DC | PRN
  Administered 2019-07-05: 500 mg via INTRAVENOUS
  Filled 2019-07-05: qty 50

## 2019-07-05 MED ORDER — CELECOXIB 200 MG PO CAPS
200.0000 mg | ORAL_CAPSULE | Freq: Two times a day (BID) | ORAL | Status: DC
  Administered 2019-07-05 – 2019-07-06 (×2): 200 mg via ORAL
  Filled 2019-07-05 (×2): qty 1

## 2019-07-05 MED ORDER — PROPOFOL 10 MG/ML IV BOLUS
INTRAVENOUS | Status: AC
  Filled 2019-07-05: qty 20

## 2019-07-05 MED ORDER — BUPIVACAINE IN DEXTROSE 0.75-8.25 % IT SOLN
INTRATHECAL | Status: DC | PRN
  Administered 2019-07-05: 2 mL via INTRATHECAL

## 2019-07-05 MED ORDER — ACETAMINOPHEN 500 MG PO TABS
1000.0000 mg | ORAL_TABLET | Freq: Once | ORAL | Status: AC
  Administered 2019-07-05: 1000 mg via ORAL
  Filled 2019-07-05: qty 2

## 2019-07-05 MED ORDER — 0.9 % SODIUM CHLORIDE (POUR BTL) OPTIME
TOPICAL | Status: DC | PRN
  Administered 2019-07-05: 1000 mL

## 2019-07-05 MED ORDER — OMEPRAZOLE 20 MG PO CPDR
20.0000 mg | DELAYED_RELEASE_CAPSULE | Freq: Two times a day (BID) | ORAL | Status: DC
  Administered 2019-07-05 – 2019-07-06 (×2): 20 mg via ORAL
  Filled 2019-07-05 (×2): qty 1

## 2019-07-05 MED ORDER — PHENOL 1.4 % MT LIQD: 1.0000 | OROMUCOSAL | Status: DC | PRN

## 2019-07-05 MED ORDER — INSULIN ASPART 100 UNIT/ML ~~LOC~~ SOLN: 0.0000 [IU] | Freq: Every day | SUBCUTANEOUS | Status: DC

## 2019-07-05 MED ORDER — PROPOFOL 1000 MG/100ML IV EMUL
INTRAVENOUS | Status: AC
  Filled 2019-07-05: qty 100

## 2019-07-05 MED ORDER — TRANEXAMIC ACID-NACL 1000-0.7 MG/100ML-% IV SOLN
1000.0000 mg | Freq: Once | INTRAVENOUS | Status: AC
  Administered 2019-07-05: 1000 mg via INTRAVENOUS
  Filled 2019-07-05: qty 100

## 2019-07-05 MED ORDER — ACETAMINOPHEN 325 MG PO TABS: 325.0000 mg | ORAL_TABLET | Freq: Four times a day (QID) | ORAL | Status: DC | PRN

## 2019-07-05 MED ORDER — MENTHOL 3 MG MT LOZG: 1.0000 | LOZENGE | OROMUCOSAL | Status: DC | PRN

## 2019-07-05 MED ORDER — DOCUSATE SODIUM 100 MG PO CAPS
100.0000 mg | ORAL_CAPSULE | Freq: Two times a day (BID) | ORAL | Status: DC
  Administered 2019-07-05 – 2019-07-06 (×2): 100 mg via ORAL
  Filled 2019-07-05 (×2): qty 1

## 2019-07-05 MED ORDER — PHENYLEPHRINE HCL (PRESSORS) 10 MG/ML IV SOLN
INTRAVENOUS | Status: AC
  Filled 2019-07-05: qty 1

## 2019-07-05 MED ORDER — CARVEDILOL 6.25 MG PO TABS
6.2500 mg | ORAL_TABLET | Freq: Two times a day (BID) | ORAL | Status: DC
  Administered 2019-07-05 – 2019-07-06 (×2): 6.25 mg via ORAL
  Filled 2019-07-05 (×2): qty 1

## 2019-07-05 MED ORDER — MONTELUKAST SODIUM 10 MG PO TABS
10.0000 mg | ORAL_TABLET | Freq: Every day | ORAL | Status: DC
  Administered 2019-07-05: 10 mg via ORAL
  Filled 2019-07-05: qty 1

## 2019-07-05 MED ORDER — ONDANSETRON HCL 4 MG/2ML IJ SOLN: 4.0000 mg | Freq: Four times a day (QID) | INTRAMUSCULAR | Status: DC | PRN

## 2019-07-05 MED ORDER — METOCLOPRAMIDE HCL 5 MG PO TABS: 5.0000 mg | ORAL_TABLET | Freq: Three times a day (TID) | ORAL | Status: DC | PRN

## 2019-07-05 MED ORDER — FENTANYL CITRATE (PF) 100 MCG/2ML IJ SOLN: 25.0000 ug | INTRAMUSCULAR | Status: DC | PRN

## 2019-07-05 MED ORDER — CEFAZOLIN SODIUM-DEXTROSE 2-4 GM/100ML-% IV SOLN
2.0000 g | Freq: Four times a day (QID) | INTRAVENOUS | Status: AC
  Administered 2019-07-05 (×2): 2 g via INTRAVENOUS
  Filled 2019-07-05 (×3): qty 100

## 2019-07-05 MED ORDER — ASPIRIN 81 MG PO CHEW
81.0000 mg | CHEWABLE_TABLET | Freq: Two times a day (BID) | ORAL | Status: DC
  Administered 2019-07-05 – 2019-07-06 (×2): 81 mg via ORAL
  Filled 2019-07-05 (×2): qty 1

## 2019-07-05 MED ORDER — CANAGLIFLOZIN 100 MG PO TABS
100.0000 mg | ORAL_TABLET | Freq: Every day | ORAL | Status: DC
  Administered 2019-07-06: 09:00:00 100 mg via ORAL
  Filled 2019-07-05: qty 1

## 2019-07-05 MED ORDER — DEXAMETHASONE SODIUM PHOSPHATE 10 MG/ML IJ SOLN
10.0000 mg | Freq: Once | INTRAMUSCULAR | Status: AC
  Administered 2019-07-06: 10 mg via INTRAVENOUS
  Filled 2019-07-05: qty 1

## 2019-07-05 SURGICAL SUPPLY — 68 items
BAG DECANTER FOR FLEXI CONT (MISCELLANEOUS) ×3 IMPLANT
BAG ZIPLOCK 12X15 (MISCELLANEOUS) ×3 IMPLANT
BLADE SAW SGTL 11.0X1.19X90.0M (BLADE) IMPLANT
BLADE SAW SGTL 18X1.27X75 (BLADE) ×2 IMPLANT
BLADE SAW SGTL 18X1.27X75MM (BLADE) ×1
BRUSH FEMORAL CANAL (MISCELLANEOUS) IMPLANT
COVER SURGICAL LIGHT HANDLE (MISCELLANEOUS) ×3 IMPLANT
COVER WAND RF STERILE (DRAPES) ×3 IMPLANT
DERMABOND ADVANCED (GAUZE/BANDAGES/DRESSINGS) ×2
DERMABOND ADVANCED .7 DNX12 (GAUZE/BANDAGES/DRESSINGS) ×1 IMPLANT
DRAPE ORTHO SPLIT 77X108 STRL (DRAPES) ×4
DRAPE POUCH INSTRU U-SHP 10X18 (DRAPES) ×3 IMPLANT
DRAPE SURG 17X11 SM STRL (DRAPES) ×3 IMPLANT
DRAPE SURG ORHT 6 SPLT 77X108 (DRAPES) ×2 IMPLANT
DRAPE U-SHAPE 47X51 STRL (DRAPES) ×3 IMPLANT
DRESSING AQUACEL AG SP 3.5X10 (GAUZE/BANDAGES/DRESSINGS) ×2 IMPLANT
DRSG AQUACEL AG ADV 3.5X10 (GAUZE/BANDAGES/DRESSINGS) IMPLANT
DRSG AQUACEL AG ADV 3.5X14 (GAUZE/BANDAGES/DRESSINGS) IMPLANT
DRSG AQUACEL AG SP 3.5X10 (GAUZE/BANDAGES/DRESSINGS) ×6
DURAPREP 26ML APPLICATOR (WOUND CARE) ×3 IMPLANT
ELECT BLADE TIP CTD 4 INCH (ELECTRODE) ×3 IMPLANT
ELECT REM PT RETURN 15FT ADLT (MISCELLANEOUS) ×3 IMPLANT
FACESHIELD WRAPAROUND (MASK) ×12 IMPLANT
GAUZE SPONGE 2X2 8PLY STRL LF (GAUZE/BANDAGES/DRESSINGS) ×1 IMPLANT
GLOVE BIOGEL M 7.0 STRL (GLOVE) IMPLANT
GLOVE BIOGEL PI IND STRL 7.5 (GLOVE) ×1 IMPLANT
GLOVE BIOGEL PI IND STRL 8.5 (GLOVE) ×1 IMPLANT
GLOVE BIOGEL PI INDICATOR 7.5 (GLOVE) ×2
GLOVE BIOGEL PI INDICATOR 8.5 (GLOVE) ×2
GLOVE ECLIPSE 8.0 STRL XLNG CF (GLOVE) ×6 IMPLANT
GOWN STRL REUS W/TWL 2XL LVL3 (GOWN DISPOSABLE) ×3 IMPLANT
GOWN STRL REUS W/TWL LRG LVL3 (GOWN DISPOSABLE) ×3 IMPLANT
GRAFT IC CHAMBER LG (Bone Implant) ×6 IMPLANT
HANDPIECE INTERPULSE COAX TIP (DISPOSABLE) ×2
HEAD FEM SKIRTED SZ36 +10 (Miscellaneous) ×3 IMPLANT
INSERT ECCENTRIC 10 36MM (Orthopedic Implant) ×3 IMPLANT
KIT TURNOVER KIT A (KITS) IMPLANT
MANIFOLD NEPTUNE II (INSTRUMENTS) ×3 IMPLANT
MARKER SKIN DUAL TIP RULER LAB (MISCELLANEOUS) ×3 IMPLANT
NDL SAFETY ECLIPSE 18X1.5 (NEEDLE) ×1 IMPLANT
NEEDLE HYPO 18GX1.5 SHARP (NEEDLE) ×2
NS IRRIG 1000ML POUR BTL (IV SOLUTION) ×6 IMPLANT
PACK TOTAL JOINT (CUSTOM PROCEDURE TRAY) ×3 IMPLANT
PADDING CAST COTTON 6X4 STRL (CAST SUPPLIES) ×3 IMPLANT
PENCIL SMOKE EVACUATOR (MISCELLANEOUS) ×3 IMPLANT
PRESSURIZER FEMORAL UNIV (MISCELLANEOUS) IMPLANT
PROTECTOR NERVE ULNAR (MISCELLANEOUS) ×3 IMPLANT
SCREW HEX LP 6.5X35 (Screw) ×3 IMPLANT
SCREW HEX LP 6.5X40 (Screw) ×3 IMPLANT
SET HNDPC FAN SPRY TIP SCT (DISPOSABLE) ×1 IMPLANT
SHELL MULTIHOLE ACETAB F 56 (Miscellaneous) ×3 IMPLANT
SPONGE GAUZE 2X2 STER 10/PKG (GAUZE/BANDAGES/DRESSINGS) ×2
SPONGE LAP 18X18 RF (DISPOSABLE) ×3 IMPLANT
SPONGE LAP 4X18 RFD (DISPOSABLE) ×3 IMPLANT
STAPLER VISISTAT 35W (STAPLE) ×3 IMPLANT
SUCTION FRAZIER HANDLE 10FR (MISCELLANEOUS) ×2
SUCTION TUBE FRAZIER 10FR DISP (MISCELLANEOUS) ×1 IMPLANT
SUT MNCRL AB 4-0 PS2 18 (SUTURE) ×3 IMPLANT
SUT STRATAFIX PDS+ 0 24IN (SUTURE) ×3 IMPLANT
SUT VIC AB 1 CT1 36 (SUTURE) ×3 IMPLANT
SUT VIC AB 2-0 CT1 27 (SUTURE) ×6
SUT VIC AB 2-0 CT1 TAPERPNT 27 (SUTURE) ×3 IMPLANT
TOWEL OR 17X26 10 PK STRL BLUE (TOWEL DISPOSABLE) ×6 IMPLANT
TOWER CARTRIDGE SMART MIX (DISPOSABLE) IMPLANT
TRAY FOLEY MTR SLVR 16FR STAT (SET/KITS/TRAYS/PACK) ×3 IMPLANT
TUBE KAMVAC SUCTION (TUBING) IMPLANT
WATER STERILE IRR 1000ML POUR (IV SOLUTION) ×3 IMPLANT
YANKAUER SUCT BULB TIP 10FT TU (MISCELLANEOUS) ×3 IMPLANT

## 2019-07-05 NOTE — H&P (Signed)
TOTAL HIP REVISION ADMISSION H&P  Patient is admitted for right revision total hip arthroplasty.  Subjective:  Chief Complaint: right hip pain  HPI: Andre Norris, 64 y.o. male, has a history of pain and functional disability in the right hip due to failed previous arthroplasty and patient has failed non-surgical conservative treatments for greater than 12 weeks to include activity modification.Primary right THA was performed in 1989 by Dr. Teena Irani in [place], [place]. 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 He began having issues with the hip in his late 20's, which resulted in total hip replacement. In the early 1990's he began having a limp, pain, and weakness in the hip, and he had a right hip polyethylene revision. This improved his pain, but he began having symptoms of pain, stiffness, and weakness last year. There is no current active infection.  There are no problems to display for this patient.  Past Medical History:  Diagnosis Date  . Arthritis   . Asthma   . BPH (benign prostatic hyperplasia)    not currently having issues  . Diabetes mellitus without complication ([place])   . DM polyneuropathy ([place])   . DOE (dyspnea on exertion)    Chronic  . Edema   . GERD (gastroesophageal reflux disease)   . History of COVID-19 04/2019   positive results noted in care everywhere  . History of kidney stones   . Hyperlipidemia   . Hypertension   . IDA (iron deficiency anemia)   . Microcytic hypochromic anemia   . Migraine   . Obese   . OSA on CPAP   . Right ventricular dysfunction    Idiopathic RV systolic dysfunction, right side enlargement    Past Surgical History:  Procedure Laterality Date  . COLONOSCOPY    . TONSILLECTOMY AND ADENOIDECTOMY    . TOTAL HIP ARTHROPLASTY Right   . TOTAL HIP REVISION Right   . UPPER GI ENDOSCOPY      Current Facility-Administered Medications  Medication Dose Route Frequency  Provider Last Rate Last Admin  . ceFAZolin (ANCEF) IVPB 2g/100 mL premix  2 g Intravenous On Call to OR Maurice March, PA-C      . chlorhexidine (HIBICLENS) 4 % liquid 4 application  60 mL Topical Once Maurice March, PA-C      . dexamethasone (DECADRON) injection 10 mg  10 mg Intravenous Once Maurice March, PA-C      . lactated ringers infusion   Intravenous Continuous Paralee Cancel, MD 10 mL/hr at 07/05/19 0601 New Bag at 07/05/19 0601  . tranexamic acid (CYKLOKAPRON) IVPB 1,000 mg  1,000 mg Intravenous To OR Maurice March, PA-C       Allergies  Allergen Reactions  . Nitroglycerin Swelling    Other reaction(s): Dizziness (intolerance)  . Amitriptyline Nausea Only    lightheaded  . Gabapentin Nausea And Vomiting    Social History   Tobacco Use  . Smoking status: Never Smoker  . Smokeless tobacco: Never Used  Substance Use Topics  . Alcohol use: Yes    Comment: occ beer    History reviewed. No pertinent family history.    Review of Systems Constitutional: Constitutional: no fever, chills, night sweats, or significant weight loss.  Cardiovascular: Cardiovascular: no palpitations or chest pain.  Respiratory: Respiratory: no cough or shortness of breath and No COPD.  Gastrointestinal: Gastrointestinal: no vomiting or nausea.  Musculoskeletal: Musculoskeletal: no swelling  in Joints and Joint Pain.  Neurologic: Neurologic: no numbness, tingling, or difficulty with balance. Objective:  Physical Exam Patient is a 64 year old male.  Well nourished and well developed. General: Alert and oriented x3, cooperative and pleasant, no acute distress. Head: normocephalic, atraumatic, neck supple. Eyes: EOMI. Respiratory: breath sounds clear in all fields, no wheezing, rales, or rhonchi. Cardiovascular: Regular rate and rhythm, no murmurs, gallops or rubs. Abdomen: non-tender to palpation and soft, normoactive bowel sounds.  Musculoskeletal: Right hip exam Tolerates  passive range of motion of the right hip well without significant pain. Mild tenderness around the hip  Calves soft and nontender. Motor function intact in LE. Strength 5/5 LE bilaterally. Neuro: Distal pulses 2+. Sensation to light touch intact in LE.   Vital signs in last 24 hours: Temp:  [98.1 F (36.7 C)] 98.1 F (36.7 C) (02/25 0613) Pulse Rate:  [65] 65 (02/25 0613) Resp:  [18] 18 (02/25 0613) BP: (130)/(80) 130/80 (02/25 0613) SpO2:  [98 %] 98 % (02/25 0613) Weight:  [96.9 kg] 96.9 kg (02/25 0532)   Labs:   Estimated body mass index is 29.79 kg/m as calculated from the following:   Height as of this encounter: 5\' 11"  (1.803 m).   Weight as of this encounter: 96.9 kg.  Imaging Review: 12/28/2018: Right hip CT scan at [facility].. Demonstrate evidence of particle debris and cystic changes evident. There is evidence of a cortical defect in the medial wall the acetabulum measuring 1.7 cm  12/13/2018: Pelvis x-[facility] at [facility] (Imaging). Standing AP pelvis, AP and frog lateral views demonstrate what appear to be a well fixed femoral component. This stem is a [facility]) stem. 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  Assessment/Plan:  Failed right total hip arthroplasty, right hip(s) with failed previous arthroplasty.  The patient history, physical examination, clinical judgement of the provider and imaging studies are consistent with failed right total hip of the right hip(s), previous total hip arthroplasty. Revision total hip arthroplasty is deemed medically necessary. The treatment options including medical management, injection therapy, arthroscopy and arthroplasty were discussed at length. The risks and benefits of total hip arthroplasty were presented and reviewed. The risks due to aseptic loosening, infection, stiffness, dislocation/subluxation,   thromboembolic complications and other imponderables were discussed.  The patient acknowledged the explanation, agreed to proceed with the plan and consent was signed. Patient is being admitted for inpatient treatment for surgery, pain control, PT, OT, prophylactic antibiotics, VTE prophylaxis, progressive ambulation and ADL's and discharge planning. The patient is planning to be discharged home.   Therapy Plans: HEP Disposition: Home with wife Planned DVT Prophylaxis: aspirin 81mg  BID DME needed: none PCP: Dr. Hotel manager, clearance recieved Cardiologist: Dr. , awaiting clearance TXA: IV Allergies: amitriptylline - light headed, gabapentin - vomiting, nitroglycerine - hypotnsion Anesthesia Concerns: none BMI: 30.7 Last HgbA1c: 6.4% Other: Uses CPAP. Hx of migraines.  Norco okay.     - Patient was instructed on what medications to stop prior to surgery. - Follow-up visit in 2 weeks with Dr. Arlan Organ - Begin physical therapy following surgery - Pre-operative lab work as pre-surgical testing - Prescriptions will be provided in hospital at time of discharge  Cammy Brochure, PA-C Orthopedic Surgery [facility] [place] [phone]

## 2019-07-05 NOTE — Transfer of Care (Signed)
Immediate Anesthesia Transfer of Care Note  Patient: Andre Norris  Procedure(s) Performed: TOTAL HIP REVISION (Right Hip)  Patient Location: PACU  Anesthesia Type:Spinal  Level of Consciousness: drowsy  Airway & Oxygen Therapy: Patient Spontanous Breathing and Patient connected to face mask oxygen  Post-op Assessment: Report given to RN and Post -op Vital signs reviewed and stable  Post vital signs: Reviewed and stable  Last Vitals:  Vitals Value Taken Time  BP 105/78 07/05/19 1015  Temp    Pulse 77 07/05/19 1016  Resp 19 07/05/19 1016  SpO2 98 % 07/05/19 1016  Vitals shown include unvalidated device data.  Last Pain:  Vitals:   07/05/19 0613  TempSrc: Oral  PainSc:          Complications: No apparent anesthesia complications

## 2019-07-05 NOTE — Evaluation (Signed)
Physical Therapy Evaluation Patient Details Name: Andre Norris MRN: [id number] DOB: 04-Apr-1956 Today's Date: 07/05/2019   History of Present Illness  Patient is 64 y.o. male s/p Rt THR on 07/05/19 via posterior approach, with PMH significant for  HTN, HLD, COVID-19, GERD, DM, asthma, OA.    Clinical Impression  Andre Norris is a 64 y.o. male POD 0 s/p Rt THR posterior approach. Patient reports independence with mobility at baseline with occasional use of SPC over the last 2 weeks. Patient is now limited by functional impairments (see PT problem list below) and requires min assist for transfers and gait with RW. Patient was able to ambulate ~100 feet with RW and min assist while maintaining PWB status on Rt LE. Patient instructed in exercise to facilitate ROM and circulation. Patient will benefit from continued skilled PT interventions to address impairments and progress towards PLOF. Acute PT will follow to progress mobility and stair training in preparation for safe discharge home.     Follow Up Recommendations Follow surgeon's recommendation for DC plan and follow-up therapies    Equipment Recommendations  None recommended by PT    Recommendations for Other Services       Precautions / Restrictions Precautions Precautions: Fall;Posterior Hip Precaution Booklet Issued: Yes (comment) Restrictions Weight Bearing Restrictions: Yes RLE Weight Bearing: Partial weight bearing RLE Partial Weight Bearing Percentage or Pounds: 50%      Mobility  Bed Mobility Overal bed mobility: Needs Assistance Bed Mobility: Supine to Sit     Supine to sit: HOB elevated;Min assist     General bed mobility comments: cues for maintaining posterior hip precautions, assist to manage Rt LE mobility.  Transfers Overall transfer level: Needs assistance Equipment used: Rolling walker (2 wheeled) Transfers: Sit to/from Stand Sit to Stand: From elevated surface;Min assist         General transfer  comment: cues for safe hand placement and to maintain Posterior hip precuations, assist required to complete power up and rise  Ambulation/Gait Ambulation/Gait assistance: Min guard Gait Distance (Feet): 100 Feet Assistive device: Rolling walker (2 wheeled) Gait Pattern/deviations: Step-to pattern;Decreased stance time - right;Decreased step length - left;Decreased stride length;Decreased weight shift to right Gait velocity: decreased   General Gait Details: cues for PWB (50%) on Rt LE and to use UE's to reduce WB in Rt LE stance phase. cues for safe step pattern and proximity to RW, pt maintained throughout. No overt LOB noted.  Stairs            Wheelchair Mobility    Modified Rankin (Stroke Patients Only)       Balance Overall balance assessment: Mild deficits observed, not formally tested          Pertinent Vitals/Pain Pain Assessment: 0-10 Pain Score: 4  Pain Location: Lt hip Pain Descriptors / Indicators: Aching;Sore;Burning Pain Intervention(s): Limited activity within patient's tolerance;Monitored during session;Repositioned;Ice applied    Home Living Family/patient expects to be discharged to:: Private residence Living Arrangements: Spouse/significant other Available Help at Discharge: Family;Available 24 hours/day Type of Home: House Home Access: Stairs to enter Entrance Stairs-Rails: None Entrance Stairs-Number of Steps: 2+1 Home Layout: One level Home Equipment: Environmental consultant - 2 wheels;Cane - single point;Grab bars - tub/shower;Shower seat;Toilet riser      Prior Function Level of Independence: Independent         Comments: occasional use of cane     Hand Dominance   Dominant Hand: Left    Extremity/Trunk Assessment   Upper Extremity Assessment Upper  Extremity Assessment: Overall WFL for tasks assessed    Lower Extremity Assessment Lower Extremity Assessment: RLE deficits/detail RLE Deficits / Details: good quad acitivation RLE Sensation:  WNL RLE Coordination: WNL    Cervical / Trunk Assessment Cervical / Trunk Assessment: Normal  Communication   Communication: No difficulties  Cognition Arousal/Alertness: Awake/alert Behavior During Therapy: WFL for tasks assessed/performed Overall Cognitive Status: Within Functional Limits for tasks assessed     General Comments      Exercises Total Joint Exercises Ankle Circles/Pumps: AROM;15 reps;Seated;Both Quad Sets: AROM;5 reps;Seated;Right   Assessment/Plan    PT Assessment Patient needs continued PT services  PT Problem List Decreased strength;Decreased balance;Decreased mobility;Decreased range of motion;Decreased activity tolerance;Decreased knowledge of use of DME;Decreased knowledge of precautions       PT Treatment Interventions DME instruction;Functional mobility training;Balance training;Patient/family education;Gait training;Therapeutic activities;Therapeutic exercise;Stair training    PT Goals (Current goals can be found in the Care Plan section)  Acute Rehab PT Goals Patient Stated Goal: get walking better again PT Goal Formulation: With patient Time For Goal Achievement: 07/12/19 Potential to Achieve Goals: Good    Frequency 7X/week    AM-PAC PT "6 Clicks" Mobility  Outcome Measure Help needed turning from your back to your side while in a flat bed without using bedrails?: A Little Help needed moving from lying on your back to sitting on the side of a flat bed without using bedrails?: A Little Help needed moving to and from a bed to a chair (including a wheelchair)?: A Little Help needed standing up from a chair using your arms (e.g., wheelchair or bedside chair)?: A Little Help needed to walk in hospital room?: A Little Help needed climbing 3-5 steps with a railing? : A Little 6 Click Score: 18    End of Session Equipment Utilized During Treatment: Gait belt Activity Tolerance: Patient tolerated treatment well Patient left: with call bell/phone  within reach;in chair;with chair alarm set Nurse Communication: Mobility status PT Visit Diagnosis: Muscle weakness (generalized) (M62.81);Difficulty in walking, not elsewhere classified (R26.2)    Time: 0177-9390 PT Time Calculation (min) (ACUTE ONLY): 18 min   Charges:   PT Evaluation $PT Eval Low Complexity: 1 Low          Verner Mould, DPT Physical Therapist with [facility] [phone]  07/05/2019 5:25 PM

## 2019-07-05 NOTE — Anesthesia Postprocedure Evaluation (Signed)
Anesthesia Post Note  Patient: Andre Norris  Procedure(s) Performed: TOTAL HIP REVISION (Right Hip)     Patient location during evaluation: PACU Anesthesia Type: Spinal Level of consciousness: oriented, awake and alert and awake Pain management: pain level controlled Vital Signs Assessment: post-procedure vital signs reviewed and stable Respiratory status: spontaneous breathing, respiratory function stable, patient connected to nasal cannula oxygen and nonlabored ventilation Cardiovascular status: blood pressure returned to baseline and stable Postop Assessment: no headache, no backache, no apparent nausea or vomiting, patient able to bend at knees and spinal receding Anesthetic complications: no    Last Vitals:  Vitals:   07/05/19 1334 07/05/19 1435  BP: (!) 141/81 134/89  Pulse: (!) 53 (!) 57  Resp: 16 16  Temp: 36.6 C 36.4 C  SpO2: 100% 100%    Last Pain:  Vitals:   07/05/19 1435  TempSrc: Oral  PainSc:     LLE Motor Response: Purposeful movement (07/05/19 1427) LLE Sensation: Full sensation (07/05/19 1427) RLE Motor Response: Purposeful movement (07/05/19 1427) RLE Sensation: Full sensation (07/05/19 1427) L Sensory Level: S1-Sole of foot, small toes (07/05/19 1427) R Sensory Level: S1-Sole of foot, small toes (07/05/19 1427)  Cecile Hearing

## 2019-07-05 NOTE — Anesthesia Procedure Notes (Signed)
Spinal  Patient location during procedure: OR Start time: 07/05/2019 7:30 AM End time: 07/05/2019 7:40 AM Staffing Performed: anesthesiologist  Anesthesiologist: Cecile Hearing, MD Preanesthetic Checklist Completed: patient identified, IV checked, risks and benefits discussed, surgical consent, monitors and equipment checked, pre-op evaluation and timeout performed Spinal Block Patient position: sitting Prep: DuraPrep and site prepped and draped Patient monitoring: continuous pulse ox and blood pressure Approach: midline Location: L3-4 Injection technique: single-shot Needle Needle type: Pencan  Needle gauge: 24 G Assessment Sensory level: T8 Additional Notes Functioning IV was confirmed and monitors were applied. Sterile prep and drape, including hand hygiene, mask and sterile gloves were used. The patient was positioned and the spine was prepped. The skin was anesthetized with lidocaine.  Free flow of clear CSF was obtained prior to injecting local anesthetic into the CSF.  The spinal needle aspirated freely following injection.  The needle was carefully withdrawn.  The patient tolerated the procedure well. Consent was obtained prior to procedure with all questions answered and concerns addressed. Risks including but not limited to bleeding, infection, nerve damage, paralysis, failed block, inadequate analgesia, allergic reaction, high spinal, itching and headache were discussed and the patient wished to proceed.   **Attempt x1 by CRNA. Attempt x1 by MDA successful.  Arrie Aran, MD

## 2019-07-06 ENCOUNTER — Encounter: Payer: Self-pay | Admitting: *Deleted

## 2019-07-06 DIAGNOSIS — E663 Overweight: Secondary | ICD-10-CM | POA: Diagnosis present

## 2019-07-06 LAB — CBC
HCT: 41.1 % (ref 39.0–52.0)
Hemoglobin: 13.1 g/dL (ref 13.0–17.0)
MCH: 29.2 pg (ref 26.0–34.0)
MCHC: 31.9 g/dL (ref 30.0–36.0)
MCV: 91.7 fL (ref 80.0–100.0)
Platelets: 177 10*3/uL (ref 150–400)
RBC: 4.48 MIL/uL (ref 4.22–5.81)
RDW: 14.8 % (ref 11.5–15.5)
WBC: 12.5 10*3/uL — ABNORMAL HIGH (ref 4.0–10.5)
nRBC: 0 % (ref 0.0–0.2)

## 2019-07-06 LAB — BASIC METABOLIC PANEL
Anion gap: 13 (ref 5–15)
BUN: 13 mg/dL (ref 8–23)
CO2: 24 mmol/L (ref 22–32)
Calcium: 8.8 mg/dL — ABNORMAL LOW (ref 8.9–10.3)
Chloride: 105 mmol/L (ref 98–111)
Creatinine, Ser: 0.96 mg/dL (ref 0.61–1.24)
GFR calc Af Amer: >60 mL/min (ref >60)
GFR calc non Af Amer: >60 mL/min (ref >60)
Glucose, Bld: 136 mg/dL — ABNORMAL HIGH (ref 70–99)
Potassium: 4.3 mmol/L (ref 3.5–5.1)
Sodium: 142 mmol/L (ref 135–145)

## 2019-07-06 LAB — TYPE AND SCREEN
ABO/RH(D): O POS
Antibody Screen: NEGATIVE

## 2019-07-06 LAB — GLUCOSE, CAPILLARY
Glucose-Capillary: 124 mg/dL — ABNORMAL HIGH (ref 70–99)
Glucose-Capillary: 131 mg/dL — ABNORMAL HIGH (ref 70–99)

## 2019-07-06 MED ORDER — HYDROCODONE-ACETAMINOPHEN 5-325 MG PO TABS: 1.0000 | ORAL_TABLET | ORAL | 0 refills | Status: AC | PRN

## 2019-07-06 MED ORDER — DOCUSATE SODIUM 100 MG PO CAPS: 100.0000 mg | ORAL_CAPSULE | Freq: Two times a day (BID) | ORAL | 0 refills | Status: AC

## 2019-07-06 MED ORDER — FERROUS SULFATE 325 (65 FE) MG PO TABS: 325.0000 mg | ORAL_TABLET | Freq: Three times a day (TID) | ORAL | 0 refills | Status: AC

## 2019-07-06 MED ORDER — METHOCARBAMOL 500 MG PO TABS: 500.0000 mg | ORAL_TABLET | Freq: Four times a day (QID) | ORAL | 0 refills | Status: AC | PRN

## 2019-07-06 MED ORDER — POLYETHYLENE GLYCOL 3350 17 G PO PACK: 17.0000 g | PACK | Freq: Two times a day (BID) | ORAL | 0 refills | Status: AC

## 2019-07-06 MED ORDER — ASPIRIN 81 MG PO CHEW: 81.0000 mg | CHEWABLE_TABLET | Freq: Two times a day (BID) | ORAL | 0 refills | Status: AC

## 2019-07-06 NOTE — TOC Initial Note (Signed)
Transition of Care [facility] [address]) - Initial/Assessment Note    Patient Details  Name: RAYDON CHAPPUIS MRN: [id number] Date of Birth: 1955/08/30  Transition of Care [facility]) CM/SW Contact:    Ida Rogue, LCSW Phone Number: 07/06/2019, 8:39 AM  Clinical Narrative:  Patient to discharge to home with Home Exercise Program; no DME needs.  TOC sign off.                 Expected Discharge Plan: Home/Self Care Barriers to Discharge: No Barriers Identified   Patient Goals and CMS Choice        Expected Discharge Plan and Services Expected Discharge Plan: Home/Self Care                                              Prior Living Arrangements/Services                       Activities of Daily Living Home Assistive Devices/Equipment: Dan Humphreys (specify type), Raised toilet seat with rails, Shower chair with back ADL Screening (condition at time of admission) Patient's cognitive ability adequate to safely complete daily activities?: Yes Is the patient deaf or have difficulty hearing?: No Does the patient have difficulty seeing, even when wearing glasses/contacts?: No Does the patient have difficulty concentrating, remembering, or making decisions?: No Patient able to express need for assistance with ADLs?: Yes Does the patient have difficulty dressing or bathing?: No Independently performs ADLs?: Yes (appropriate for developmental age) Does the patient have difficulty walking or climbing stairs?: Yes Weakness of Legs: Right Weakness of Arms/Hands: None  Permission Sought/Granted                  Emotional Assessment              Admission diagnosis:  S/P revision of total hip [Z96.649] Patient Active Problem List   Diagnosis Date Noted  . S/P revision of total hip 07/05/2019   PCP:  Patient, No Pcp Per Pharmacy:   [id number] - [place], [place] - [address] [place] [address] Phone: [phone] Fax:  [phone]     Social Determinants of Health (SDOH) Interventions    Readmission Risk Interventions No flowsheet data found.

## 2019-07-06 NOTE — Progress Notes (Signed)
     Subjective: 1 Day Post-Op Procedure(s) (LRB): TOTAL HIP REVISION (Right)   Patient reports pain as mild, pain controlled.  No events throughout the night. Dr. Charlann Boxer discussed the procedure, findings and expectations moving forward.  Ready to be discharged home, if he does well with PT.       Objective:   VITALS:   Vitals:   07/06/19 0600 07/06/19 0916  BP: 114/64 121/69  Pulse: (!) 51 61  Resp: 18   Temp: 97.7 F (36.5 C) 97.6 F (36.4 C)  SpO2: 98% 100%    Dorsiflexion/Plantar flexion intact Incision: dressing C/D/I No cellulitis present Compartment soft  LABS Recent Labs    07/06/19 0223  HGB 13.1  HCT 41.1  WBC 12.5*  PLT 177    Recent Labs    07/06/19 0223  NA 142  K 4.3  BUN 13  CREATININE 0.96  GLUCOSE 136*     Assessment/Plan: 1 Day Post-Op Procedure(s) (LRB): TOTAL HIP REVISION (Right) Foley cath d/c'ed Advance diet Up with therapy D/C IV fluids Discharge home Follow up in 2 weeks at [facility] Follow up with OLIN,Haadiya Frogge D in 2 weeks.  Contact information:  [facility] [address], [address] [place] [address] [phone]    Overweight (BMI 25-29.9) Estimated body mass index is 29.79 kg/m as calculated from the following:   Height as of this encounter: 5\' 11"  (1.803 m).   Weight as of this encounter: 96.9 kg. Patient also counseled that weight may inhibit the healing process Patient counseled that losing weight will help with future health issues         PA-C  [facility] is now [facility]  [place] [address], [place], [place] Phone: [phone] [email or web address] Facebook  [phone]

## 2019-07-06 NOTE — Progress Notes (Signed)
Physical Therapy Treatment Patient Details Name: AJMAL KATHAN MRN: [id number] DOB: 1955/11/19 Today's Date: 07/06/2019    History of Present Illness Patient is 64 y.o. male s/p Rt THR on 07/05/19 via posterior approach, with PMH significant for  HTN, HLD, COVID-19, GERD, DM, asthma, OA.    PT Comments    POD # 1 pm session Spouse present during session.  General bed mobility comments: demonstarted and instructed pt how to use a belt loop to self assist LE off/onto bed.  General transfer comment: 50% VC's on proper tech to avoid hip flex >90 degrees and safety with turns. General Gait Details: 25% VC's on proper walker to self distance and 50% WBing.  Pt tolerated a functional distance.  General stair comments: with spouse present for "hands on" instruction up backward due to [facility].  Performed twice. Then returned to room to perform some TE's following HEP handout.  Instructed on proper tech, freq as well as use of ICE.    Addressed all mobility questions, discussed appropriate activity, educated on use of ICE.  Pt ready for D/C to home.   Follow Up Recommendations  Follow surgeon's recommendation for DC plan and follow-up therapies     Equipment Recommendations  None recommended by PT    Recommendations for Other Services       Precautions / Restrictions Precautions Precaution Comments: instructed on THP and 50% WBing restriction Restrictions Weight Bearing Restrictions: Yes RLE Weight Bearing: Partial weight bearing RLE Partial Weight Bearing Percentage or Pounds: 50%    Mobility  Bed Mobility Overal bed mobility: Needs Assistance Bed Mobility: Supine to Sit           General bed mobility comments: demonstarted and instructed pt how to use a belt loop to self assist LE off/onto bed  Transfers Overall transfer level: Needs assistance Equipment used: Rolling walker (2 wheeled) Transfers: Sit to/from [employer] Transfers Sit to Stand: From elevated surface;Min  assist;Min guard Stand pivot transfers: Min assist       General transfer comment: 50% VC's on proper tech to avoid hip flex >90 degrees and safety with turns  Ambulation/Gait Ambulation/Gait assistance: Supervision;Min guard Gait Distance (Feet): 45 Feet Assistive device: Rolling walker (2 wheeled) Gait Pattern/deviations: Step-to pattern;Decreased stance time - right;Decreased step length - left;Decreased stride length;Decreased weight shift to right Gait velocity: decreased   General Gait Details: 25% VC's on proper walker to self distance and 50% WBing.  Pt tolerated a functional distance.   Stairs Stairs: Yes Stairs assistance: Min assist Stair Management: No rails;Step to pattern;Backwards;With walker Number of Stairs: 2 General stair comments: with spouse present for "hands on" instruction up backward due to [place].  Performed twice.   Wheelchair Mobility    Modified Rankin (Stroke Patients Only)       Balance                                            Cognition Arousal/Alertness: Awake/alert Behavior During Therapy: WFL for tasks assessed/performed Overall Cognitive Status: Within Functional Limits for tasks assessed                                        Exercises  5 reps all standing TE's ICE     General Comments  Pertinent Vitals/Pain Pain Assessment: 0-10 Pain Score: 5  Pain Location: L hip Pain Descriptors / Indicators: Aching;Sore;Burning;Operative site guarding;Tender Pain Intervention(s): Monitored during session;Premedicated before session;Repositioned;Ice applied    Home Living                      Prior Function            PT Goals (current goals can now be found in the care plan section) Progress towards PT goals: Progressing toward goals    Frequency    7X/week      PT Plan Current plan remains appropriate    Co-evaluation              AM-PAC PT "6 Clicks" Mobility    Outcome Measure  Help needed turning from your back to your side while in a flat bed without using bedrails?: A Little Help needed moving from lying on your back to sitting on the side of a flat bed without using bedrails?: A Little Help needed moving to and from a bed to a chair (including a wheelchair)?: A Little Help needed standing up from a chair using your arms (e.g., wheelchair or bedside chair)?: A Little Help needed to walk in hospital room?: A Little Help needed climbing 3-5 steps with a railing? : A Little 6 Click Score: 18    End of Session Equipment Utilized During Treatment: Gait belt Activity Tolerance: Patient tolerated treatment well Patient left: with call bell/phone within reach;in chair;with chair alarm set Nurse Communication: Mobility status PT Visit Diagnosis: Muscle weakness (generalized) (M62.81);Difficulty in walking, not elsewhere classified (R26.2)     Time: 1355-1420 PT Time Calculation (min) (ACUTE ONLY): 25 min  Charges:  $Gait Training: 8-22 mins $Therapeutic Activity: 8-22 mins                     Felecia Shelling  PTA Acute  Rehabilitation Services Pager      [phone] Office      [phone]

## 2019-07-06 NOTE — Progress Notes (Signed)
Physical Therapy Treatment Patient Details Name: Andre Norris MRN: [id number] DOB: 28-Mar-1956 Today's Date: 07/06/2019    History of Present Illness Patient is 64 y.o. male s/p Rt THR on 07/05/19 via posterior approach, with PMH significant for  HTN, HLD, COVID-19, GERD, DM, asthma, OA.    PT Comments    POD # 1 am session Assisted OOB to amb.  General bed mobility comments: demonstarted and instructed pt how to use a belt loop to self assist LE off/onto bed  General transfer comment: 50% VC's on proper tech to avoid hip flex >90 degrees and safety with turns  General Gait Details: 25% VC's on proper walker to self distance and 50% WBing.  Pt tolerated a functional distance. Then returned to room to perform some TE's following HEP handout.  Instructed on proper tech, freq as well as use of ICE.   Pt will need another session to address stairs and remaining TE's.   Follow Up Recommendations  Follow surgeon's recommendation for DC plan and follow-up therapies     Equipment Recommendations  None recommended by PT    Recommendations for Other Services       Precautions / Restrictions Precautions Precaution Comments: instructed on THP and 50% WBing restriction Restrictions Weight Bearing Restrictions: Yes RLE Weight Bearing: Partial weight bearing RLE Partial Weight Bearing Percentage or Pounds: 50%    Mobility  Bed Mobility Overal bed mobility: Needs Assistance Bed Mobility: Supine to Sit  Min Assist and increased time          General bed mobility comments: demonstarted and instructed pt how to use a belt loop to self assist LE off/onto bed  Transfers Overall transfer level: Needs assistance Equipment used: Rolling walker (2 wheeled) Transfers: Sit to/from [employer] Transfers Sit to Stand: From elevated surface;Min assist;Min guard Stand pivot transfers: Min assist       General transfer comment: 50% VC's on proper tech to avoid hip flex >90 degrees and safety  with turns  Ambulation/Gait Ambulation/Gait assistance: Supervision;Min guard Gait Distance (Feet): 45 Feet Assistive device: Rolling walker (2 wheeled) Gait Pattern/deviations: Step-to pattern;Decreased stance time - right;Decreased step length - left;Decreased stride length;Decreased weight shift to right Gait velocity: decreased   General Gait Details: 25% VC's on proper walker to self distance and 50% WBing.  Pt tolerated a functional distance.   Stairs             Wheelchair Mobility    Modified Rankin (Stroke Patients Only)       Balance                                            Cognition Arousal/Alertness: Awake/alert Behavior During Therapy: WFL for tasks assessed/performed Overall Cognitive Status: Within Functional Limits for tasks assessed                                        Exercises   Total Hip Replacement TE's 10 reps ankle pumps 10 reps knee presses 10 reps heel slides 10 reps SAQ's 10 reps ABD Followed by ICE     General Comments        Pertinent Vitals/Pain Pain Assessment: 0-10 Pain Score: 5  Pain Location: L hip Pain Descriptors / Indicators: Aching;Sore;Burning;Operative site guarding;Tender Pain Intervention(s): Monitored during  session;Premedicated before session;Repositioned;Ice applied    Home Living                      Prior Function            PT Goals (current goals can now be found in the care plan section) Progress towards PT goals: Progressing toward goals    Frequency    7X/week      PT Plan Current plan remains appropriate    Co-evaluation              AM-PAC PT "6 Clicks" Mobility   Outcome Measure  Help needed turning from your back to your side while in a flat bed without using bedrails?: A Little Help needed moving from lying on your back to sitting on the side of a flat bed without using bedrails?: A Little Help needed moving to and from a bed  to a chair (including a wheelchair)?: A Little Help needed standing up from a chair using your arms (e.g., wheelchair or bedside chair)?: A Little Help needed to walk in hospital room?: A Little Help needed climbing 3-5 steps with a railing? : A Little 6 Click Score: 18    End of Session Equipment Utilized During Treatment: Gait belt Activity Tolerance: Patient tolerated treatment well Patient left: with call bell/phone within reach;in chair;with chair alarm set Nurse Communication: Mobility status PT Visit Diagnosis: Muscle weakness (generalized) (M62.81);Difficulty in walking, not elsewhere classified (R26.2)     Time: 1010-1036 PT Time Calculation (min) (ACUTE ONLY): 26 min  Charges:  $Gait Training: 8-22 mins $Therapeutic Exercise: 8-22 mins                     Rica Koyanagi  PTA Acute  Rehabilitation Services Pager      [phone] Office      [phone]

## 2019-07-06 NOTE — Plan of Care (Signed)
All discharge instructions were given to Pt.  

## 2019-07-06 NOTE — Brief Op Note (Signed)
07/05/2019  7:28 AM  PATIENT:  Andre Norris  64 y.o. male  PRE-OPERATIVE DIAGNOSIS:  Aseptic failure right total hip  POST-OPERATIVE DIAGNOSIS:  Aseptic failure right total hip  PROCEDURE:  Procedure(s) with comments: TOTAL HIP REVISION (Right) - 90 mins  SURGEON:  Surgeon(s) and Role:    Durene Romans, MD - Primary  PHYSICIAN ASSISTANT: Dennie Bible, PA-C  ANESTHESIA:   general  EBL:  500 mL   BLOOD ADMINISTERED:none  DRAINS: none   LOCAL MEDICATIONS USED:  NONE  SPECIMEN:  No Specimen  DISPOSITION OF SPECIMEN:  N/A  COUNTS:  YES  TOURNIQUET:  * No tourniquets in log *  DICTATION: .Other Dictation: Dictation Number [id number]  PLAN OF CARE: Admit to inpatient   PATIENT DISPOSITION:  PACU - hemodynamically stable.   Delay start of Pharmacological VTE agent (>24hrs) due to surgical blood loss or risk of bleeding: no

## 2019-07-06 NOTE — Op Note (Signed)
NAMEKURON, DOCKEN MEDICAL RECORD QM:[id number] ACCOUNT [id number] DATE OF BIRTH:[birth year] FACILITY: [facility] LOCATION: [facility] PHYSICIAN:Aloni Chuang Marian Sorrow, MD  OPERATIVE REPORT  DATE OF PROCEDURE:  07/05/2019  PREOPERATIVE DIAGNOSIS:  Aseptic failure, right hip, related osteolysis.  POSTOPERATIVE DIAGNOSIS:  Aseptic failure, right hip, related osteolysis.  PROCEDURE:  Revision right total hip replacement.  COMPONENTS USED:  Stryker revision acetabular system with a size 56 mm Trident II acetabular shell with 2 cancellous screws, a size F Eccentric 36+10 liner with a 36+10 mm metal ball.  We used 30 mL of cancellous allograft bone to fill voids.  SURGEON:  Paralee Cancel, MD  ASSISTANT:  Asencion Partridge, PA-C.  Note that Ms. Sheran Lawless was present for the entirety of the case from preoperative positioning, perioperative management of the operative extremity, general facilitation of the case and primary wound closure.  ANESTHESIA:  General.  SPECIMENS:  None.  COMPLICATIONS:  None.  ESTIMATED BLOOD LOSS:  About 500 mL.  DRAINS:  None.  INDICATIONS:  The patient is a pleasant 64 year old male with a history of index right total hip replacement in 1989.  He had subsequent revision about 13 years later due to polyethylene wear.  He presented to the office last year with increasing pain in  the right hip.  CT scan had indicated significant wear in the posterior aspect of the acetabulum.  We had a long discussion regarding his age, activity level, and the pain in his right hip, and the need for revision surgery for longevity of the  component.  Risks of persistent pain, infection, DVT, dislocation, failure of new construct were all reviewed.  Consent was obtained for benefit of pain relief and long-term functionality of the hip.  DESCRIPTION OF PROCEDURE:  The patient was brought to the operative theater.  Once adequate anesthesia, preoperative antibiotics, Ancef administered as well as  tranexamic acid and Decadron, he was positioned into the left lateral decubitus position with  the right hip up.  The right lower extremity was prepped and draped in sterile fashion.  Prior surgical incisions were more for a lateral based approach.  We utilized the posterior approach.  Skin incision was made for posterior approach posteriorly.   Soft tissue dissection was carried down to the fascia, the gluteal fascia, and IT band.  The gluteal fascia was incised and the muscle split for posterior approach.  The posterior aspect of the hip was exposed.  Once we entered the joint capsule itself  encountered clear synovial fluid.  Following initial exposure of the posterior aspect of the acetabulum, exposing the posterior acetabulum and acetabular shell I was able to dislocate the hip fairly easily.  We then removed the femoral head.  Following  further soft tissue debridement and exposure, I was able to place the trunnion of the femoral component onto the ilium and retract the femur anteriorly.  We then used the [employer] cup cutters and worked around the posterior half of the cup and were able  to get this circumferentially freed from the bone.  I then worked anteriorly and was eventually able to remove the shell.  It was noted that shell was not grossly loose and definitely had bony ingrowth, but behind the shell once we removed it, there were  large defects into the ischium and into the ilium as well as a medial wall defect that was not anticipated radiographically based on previous cup position.  The CT scan had confirmed these large areas of lytic lesions.  With the acetabular shell  removed  I then began reaming with a 46 mm reamer and then reamed up to 55 mm.  Following this, we irrigated the hip acetabular area out completely with a pulse lavage.  We then placed 30 mL of cancellous chips and impacted into the defects and then reverse  reamed with a 54 reamer.  I selected a 56 mm shell.  This was impacted  and actually had good fixation on the anterior and posterior columns.  I was able to place 2 cancellous screws into the ilium with good fixation as well.  At this point, we did trial  reductions.  Elected based on the position of the shell to try a lip liner to prevent dislocations posteriorly.  With an Eccentric 10-degree liner with the liner positioned around the 9-10 o'clock position we did trial reduction.  Based on the trial  selected the 36+10 ball.  The +5 ball seemed to have some leveraging on soft tissues anteriorly.  So I selected the size F liner to match the 56 shell, but we used an Eccentric 10-degree face-changing liner with a 36 inner diameter.  This was opened.   Prior to placing this, I worked on debridement of anterior bone.  He was noted to have some heterotopic bone, most likely within the gluteal tendons from his previous anterior approach.  I was able to remove further soft tissue along the anterior aspect  of the trochanter and hip.  The final 36 Eccentric 10-degree face-changing lip liner was then impacted again with the hip.  The final liner was impacted again with the lipped portion around 9-10 o'clock for this right hip.  I then, based on the trial  reduction, selected a 36+10 metal ball.  There was no ceramic option for this previously placed femoral component.  The ball was impacted on the clean and dried trunnion.  The hip was reduced.  We irrigated the hip throughout the case again at this  point.  There was no capsular repair posteriorly.  We reapproximated the fascia with the gluteal fascia with #1 Vicryl and #1 Stratafix suture.  The remaining wound was closed with 2-0 Vicryl and a running Monocryl stitch.  The hip was then cleaned,  dried and dressed sterilely with surgical glue and Aquacel dressing.  He was then brought to the recovery room in stable condition, tolerating the procedure well.  He was then admitted to the hospital.  We will have him work with physical  therapy here.  I will have him be partial weightbearing for probably 6 weeks to allow  for bony ingrowth given the defects noted within the acetabulum and bone graft was utilized.  CN/NUANCE  D:07/06/2019 T:07/06/2019 JOB:[id number]

## 2019-07-10 NOTE — Discharge Summary (Signed)
Physician Discharge Summary  Patient ID: Andre Norris MRN: [id number] DOB/AGE: Jan 27, 1956 64 y.o.  Admit date: 07/05/2019 Discharge date: 07/06/2019   Procedures:  Procedure(s) (LRB): TOTAL HIP REVISION (Right)  Attending Physician:  Dr. Paralee Cancel   Admission Diagnoses:   Right hip primary OA / pain  Discharge Diagnoses:  Active Problems:   S/P revision of total hip   Overweight (BMI 25.0-29.9)  Past Medical History:  Diagnosis Date  . Arthritis   . Asthma   . BPH (benign prostatic hyperplasia)    not currently having issues  . Diabetes mellitus without complication ([place])   . DM polyneuropathy ([place])   . DOE (dyspnea on exertion)    Chronic  . Edema   . GERD (gastroesophageal reflux disease)   . History of COVID-19 04/2019   positive results noted in care everywhere  . History of kidney stones   . Hyperlipidemia   . Hypertension   . IDA (iron deficiency anemia)   . Microcytic hypochromic anemia   . Migraine   . Obese   . OSA on CPAP   . Right ventricular dysfunction    Idiopathic RV systolic dysfunction, right side enlargement    HPI:    Andre Norris, 64 y.o. male, has a history of pain and functional disability in the right hip due to failed previous arthroplasty and patient has failed non-surgical conservative treatments for greater than 12 weeks to include activity modification.Primary right THA was performed in 1989 by Dr. Teena Irani in [place], [place]. 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 He began having issues with the hip in his late 20's, which resulted in total hip replacement. In the early 1990's he began having a limp, pain, and weakness in the hip, and he had a right hip polyethylene revision. This improved his pain, but he began having symptoms of pain, stiffness, and weakness last year. There is no current active infection.   PCP: Patient, No Pcp Per   Discharged Condition:  good  Hospital Course:  Patient underwent the above stated procedure on 07/05/2019. Patient tolerated the procedure well and brought to the recovery room in good condition and subsequently to the floor.  POD #1 BP: 121/69 ; Pulse: 61 ; Temp: 97.6 F (36.4 C) ; Resp: 18 Patient reports pain as mild, pain controlled.  No events throughout the night. Dr. Alvan Dame discussed the procedure, findings and expectations moving forward.  Ready to be discharged home. Dorsiflexion/plantar flexion intact, incision: dressing C/D/I, no cellulitis present and compartment soft.   LABS  Basename    HGB     13.1  HCT     41.1    Discharge Exam: General appearance: alert, cooperative and no distress Extremities: Homans sign is negative, no sign of DVT, no edema, redness or tenderness in the calves or thighs and no ulcers, gangrene or trophic changes  Disposition: Home with follow up in 2 weeks   Follow-up Information    Paralee Cancel, MD. Schedule an appointment as soon as possible for a visit in 2 weeks.   Specialty: Orthopedic Surgery Contact information: [address] [place] [address] [phone]           Discharge Instructions    Call MD / Call 911   Complete by: As directed    If you experience chest pain or shortness of breath, CALL 911 and be transported to the  hospital emergency room.  If you develope a fever above 101 F, pus (white drainage) or increased drainage or redness at the wound, or calf pain, call your surgeon's office.   Change dressing   Complete by: As directed    Maintain surgical dressing until follow up in the clinic. If the edges start to pull up, may reinforce with tape. If the dressing is no longer working, may remove and cover with gauze and tape, but must keep the area dry and clean.  Call with any questions or concerns.   Constipation Prevention   Complete by: As directed    Drink plenty of fluids.  Prune juice may be helpful.  You may use a  stool softener, such as Colace (over the counter) 100 mg twice a day.  Use MiraLax (over the counter) for constipation as needed.   Diet - low sodium heart healthy   Complete by: As directed    Discharge instructions   Complete by: As directed    Maintain surgical dressing until follow up in the clinic. If the edges start to pull up, may reinforce with tape. If the dressing is no longer working, may remove and cover with gauze and tape, but must keep the area dry and clean.  Follow [facility] in 2 weeks at [facility]. Call with any questions or concerns.   Follow the hip precautions as taught in Physical Therapy   Complete by: As directed    Increase activity slowly as tolerated   Complete by: As directed    Partial weight bearing with assist device as directed.  50% right leg.   TED hose   Complete by: As directed    Use stockings (TED hose) for 2 weeks on both leg(s).  You may remove them at night for sleeping.      Allergies as of 07/06/2019      Reactions   Nitroglycerin Swelling   Other reaction(s): Dizziness (intolerance)   Amitriptyline Nausea Only   lightheaded   Gabapentin Nausea And Vomiting      Medication List    STOP taking these medications   aspirin EC 81 MG tablet Replaced by: aspirin 81 MG chewable tablet     TAKE these medications   Alpha-Lipoic Acid 200 MG Caps Take 200 mg by mouth daily.   aspirin 81 MG chewable tablet Commonly known as: Aspirin Childrens Chew 1 tablet (81 mg total) by mouth 2 (two) times daily. Take for 4 weeks, then resume regular dose. Replaces: aspirin EC 81 MG tablet   atorvastatin 20 MG tablet Commonly known as: LIPITOR Take 20 mg by mouth daily.   BOSWELLIA PO Take 1 tablet by mouth 2 (two) times daily.   carvedilol 6.25 MG tablet Commonly known as: COREG Take 6.25 mg by mouth 2 (two) times daily with a meal.   docusate sodium 100 MG capsule Commonly known as: Colace Take 1 capsule (100 mg total) by mouth 2 (two) times  daily.   Emgality 120 MG/ML Soaj Generic drug: Galcanezumab-gnlm Inject 120 mg into the skin every 30 (thirty) days.   ferrous sulfate 325 (65 FE) MG tablet Commonly known as: FerrouSul Take 1 tablet (325 mg total) by mouth 3 (three) times daily with meals for 14 days. What changed: when to take this   folic acid 800 MCG tablet Commonly known as: FOLVITE Take 800 mcg by mouth daily.   furosemide 20 MG tablet Commonly known as: LASIX Take 20 mg by mouth.   HYDROcodone-acetaminophen 5-325 MG  tablet Commonly known as: Norco Take 1-2 tablets by mouth every 4 (four) hours as needed for moderate pain or severe pain.   Jardiance 10 MG Tabs tablet Generic drug: empagliflozin Take 10 mg by mouth daily.   metFORMIN 1000 MG tablet Commonly known as: GLUCOPHAGE Take 1,000 mg by mouth 2 (two) times daily with a meal.   methocarbamol 500 MG tablet Commonly known as: Robaxin Take 1 tablet (500 mg total) by mouth every 6 (six) hours as needed for muscle spasms.   montelukast 10 MG tablet Commonly known as: SINGULAIR Take 10 mg by mouth at bedtime.   multivitamin with minerals Tabs tablet Take 1 tablet by mouth daily.   omeprazole 20 MG capsule Commonly known as: PRILOSEC Take 20 mg by mouth 2 (two) times daily before a meal.   polyethylene glycol 17 g packet Commonly known as: MIRALAX / GLYCOLAX Take 17 g by mouth 2 (two) times daily.   valsartan 320 MG tablet Commonly known as: DIOVAN Take 320 mg by mouth daily.            Discharge Care Instructions  (From admission, onward)         Start     Ordered   07/06/19 0000  Change dressing    Comments: Maintain surgical dressing until follow up in the clinic. If the edges start to pull up, may reinforce with tape. If the dressing is no longer working, may remove and cover with gauze and tape, but must keep the area dry and clean.  Call with any questions or concerns.   07/06/19 0940           Signed: Anastasio Auerbach.  Davit Vassar   PA-C  07/10/2019, 8:27 AM

## 2020-03-28 IMAGING — CT CT OF THE RIGHT HIP WITHOUT CONTRAST
1 series · 15 of 32 positions shown, 19 images · non-contrast
Comparison: None.

CLINICAL DATA: Patient status post right hip replacement in 9919
and subsequent revision in 1221. Worsening right hip pain over the
past 6 months. No known injury.

EXAM:
CT OF THE RIGHT HIP WITHOUT CONTRAST
TECHNIQUE: Multidetector CT imaging of the right hip was performed according to
the standard protocol. Multiplanar CT image reconstructions were
also generated.

[Series 3: soft tissue pelvis/hip · axial · 0.43mm/px · z∈[-299,-80]mm · 15 of 82 slices shown, 19 images]
[im 6/82  soft-tissue]
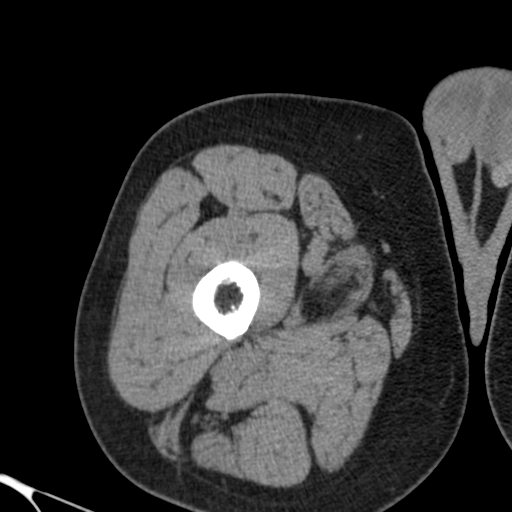
[im 6/82  bone]
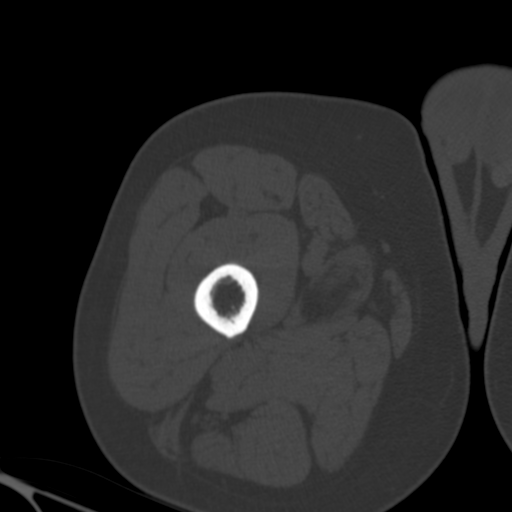
[im 11/82  soft-tissue]
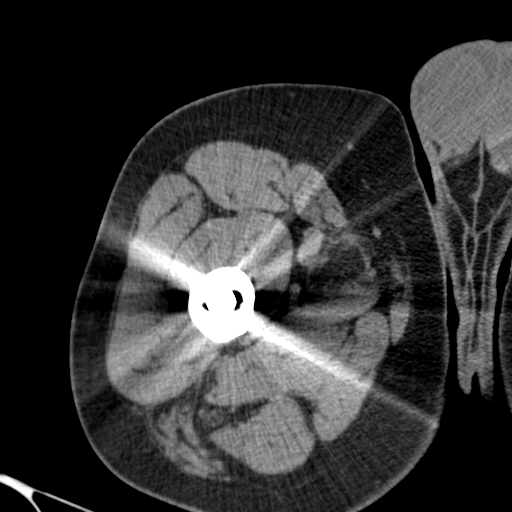
[im 16/82  soft-tissue]
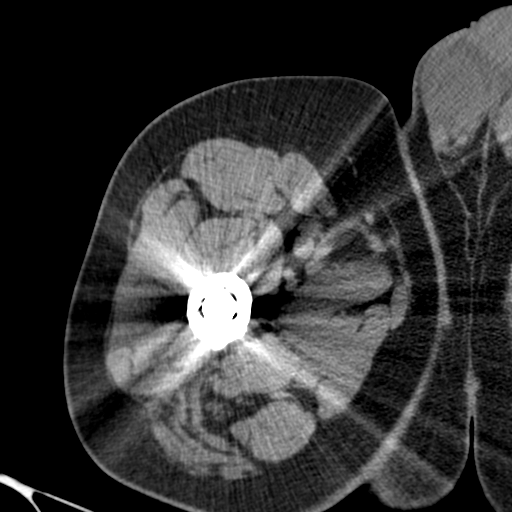
[im 24/82  soft-tissue]
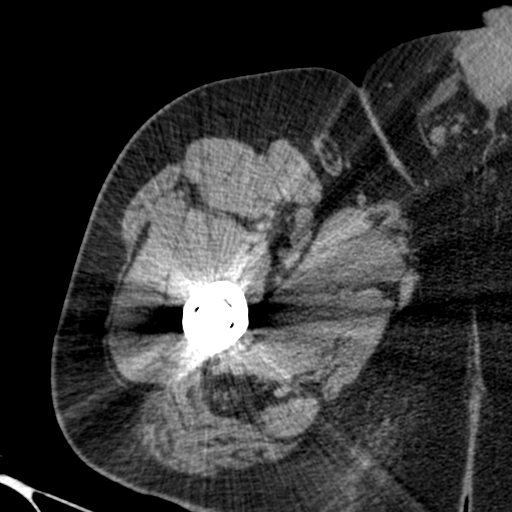
[im 29/82  soft-tissue]
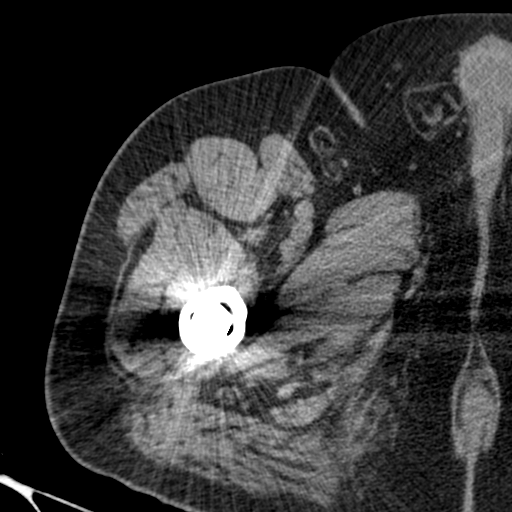
[im 34/82  soft-tissue]
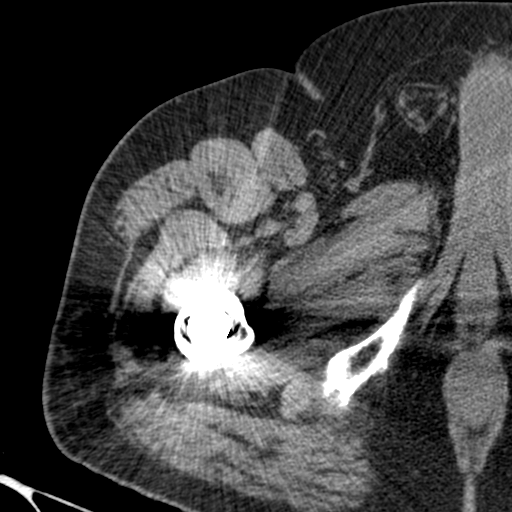
[im 42/82  soft-tissue]
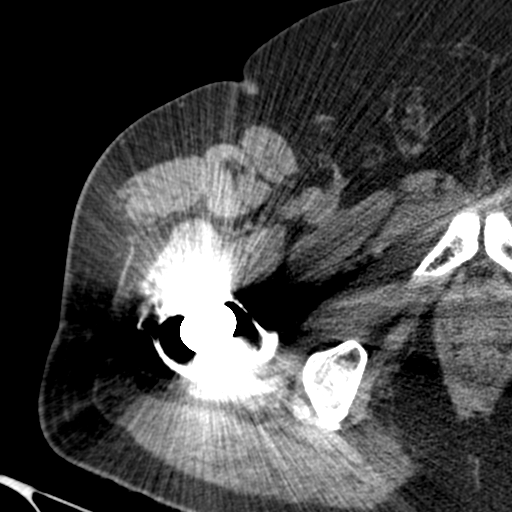
[im 48/82  soft-tissue]
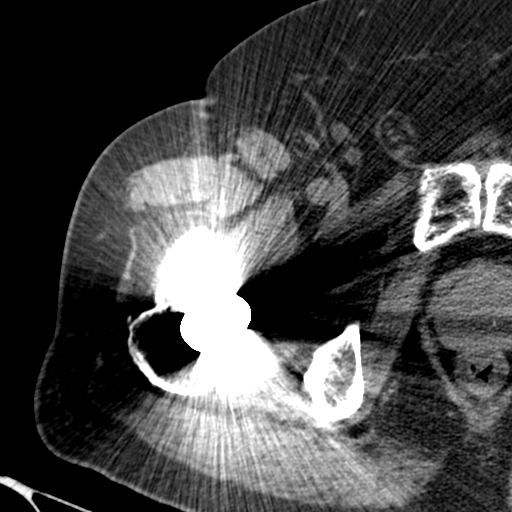
[im 53/82  soft-tissue]
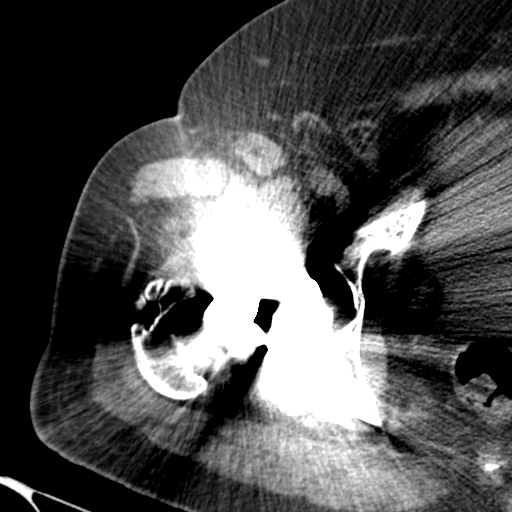
[im 53/82  bone]
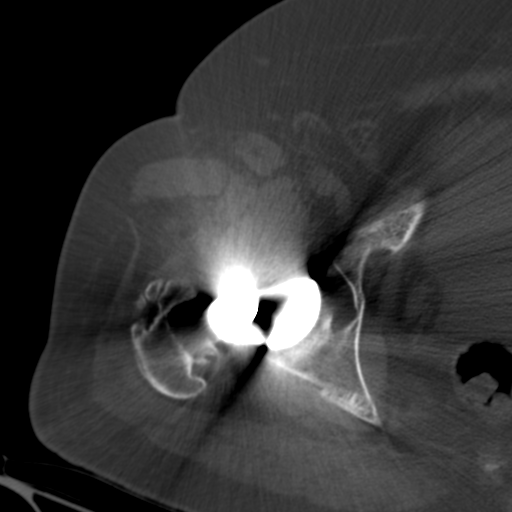
[im 58/82  soft-tissue]
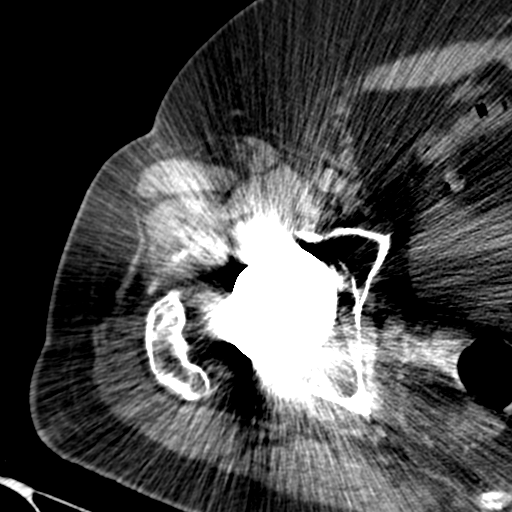
[im 66/82  soft-tissue]
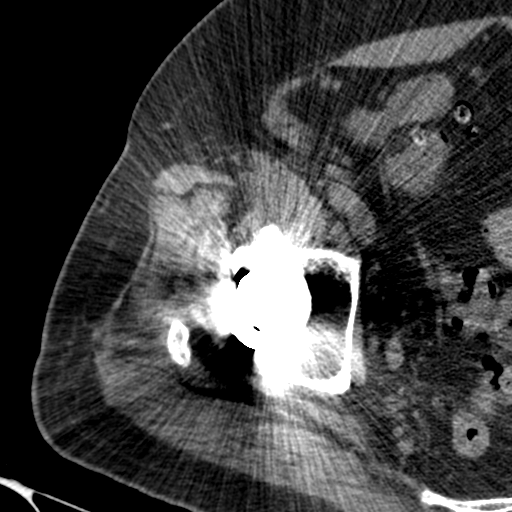
[im 71/82  soft-tissue]
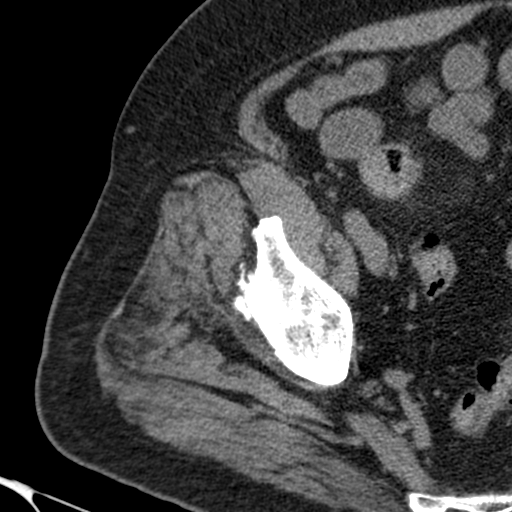
[im 71/82  lung]
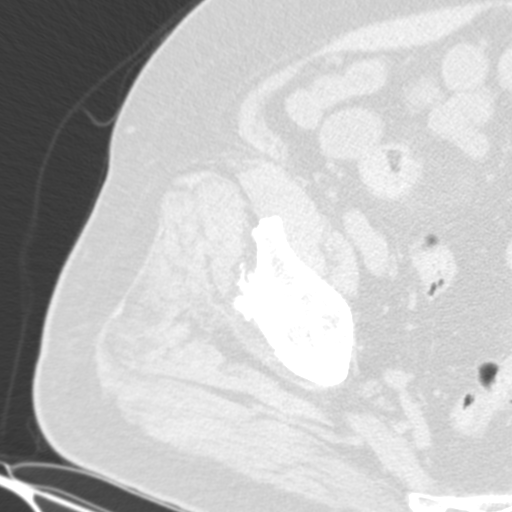
[im 74/82  lung]
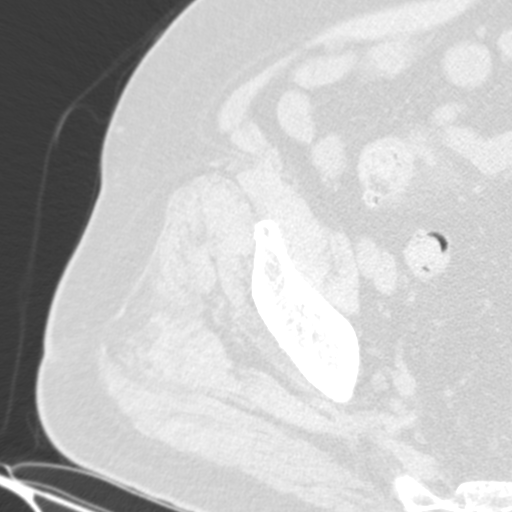
[im 76/82  soft-tissue]
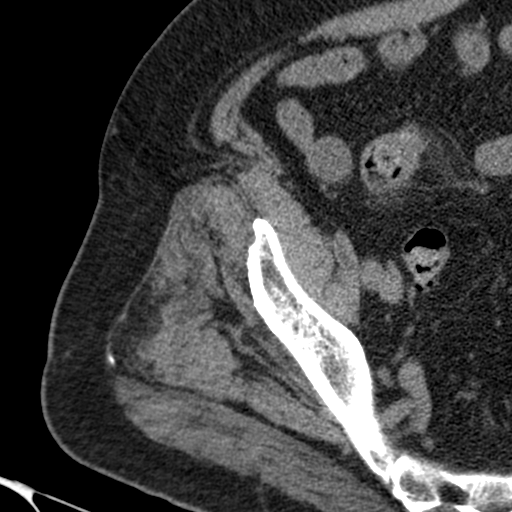
[im 76/82  lung]
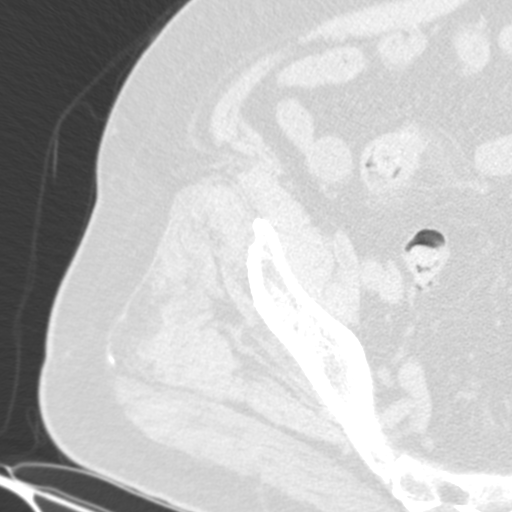
[im 79/82  lung]
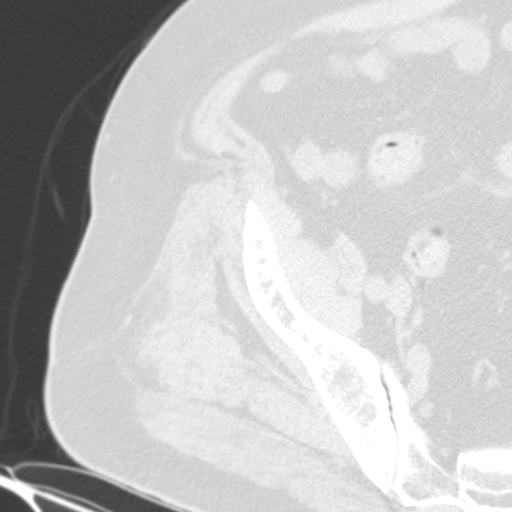

[15 of 32 positions shown; findings below may reference images not displayed]

FINDINGS: Bones/Joint/Cartilage

Total hip arthroplasty is in place. The device is located. No
fracture or acute bony abnormality is identified. There is a large
lucent lesion in the right acetabulum. Main component of the lesion
is 4.5 cm craniocaudal by 4 cm AP by 1.5 cm transverse. There is a
defect in the medial wall of the acetabulum measuring 1.7 cm AP by
1.7 cm craniocaudal. Extensive lucency is also present about the
proximal 4 cm of the femoral component.

No acute bony abnormality is identified.

Ligaments

Suboptimally assessed by CT.

Muscles and Tendons

Moderate fatty atrophy of the mid to distal adductor magnus.
Musculature is otherwise normal appearance.

Soft tissues

Negative.
IMPRESSION: Extensive lucency about the acetabular and proximal femoral
components of the patient's right hip arthroplasty most consistent
with particle disease. Cortical defect in the medial wall of the
acetabulum measuring 1.7 cm in diameter is noted.

Moderate fatty atrophy of the mid to distal at adductor magnus.

No acute abnormality.

## 2020-10-03 IMAGING — DX DG PORTABLE PELVIS
1 series · 1 of 1 positions shown · non-contrast
Comparison: None.

CLINICAL DATA: Status post right hip replacement.

EXAM:
PORTABLE PELVIS 1-2 VIEWS

[pelvis ap]
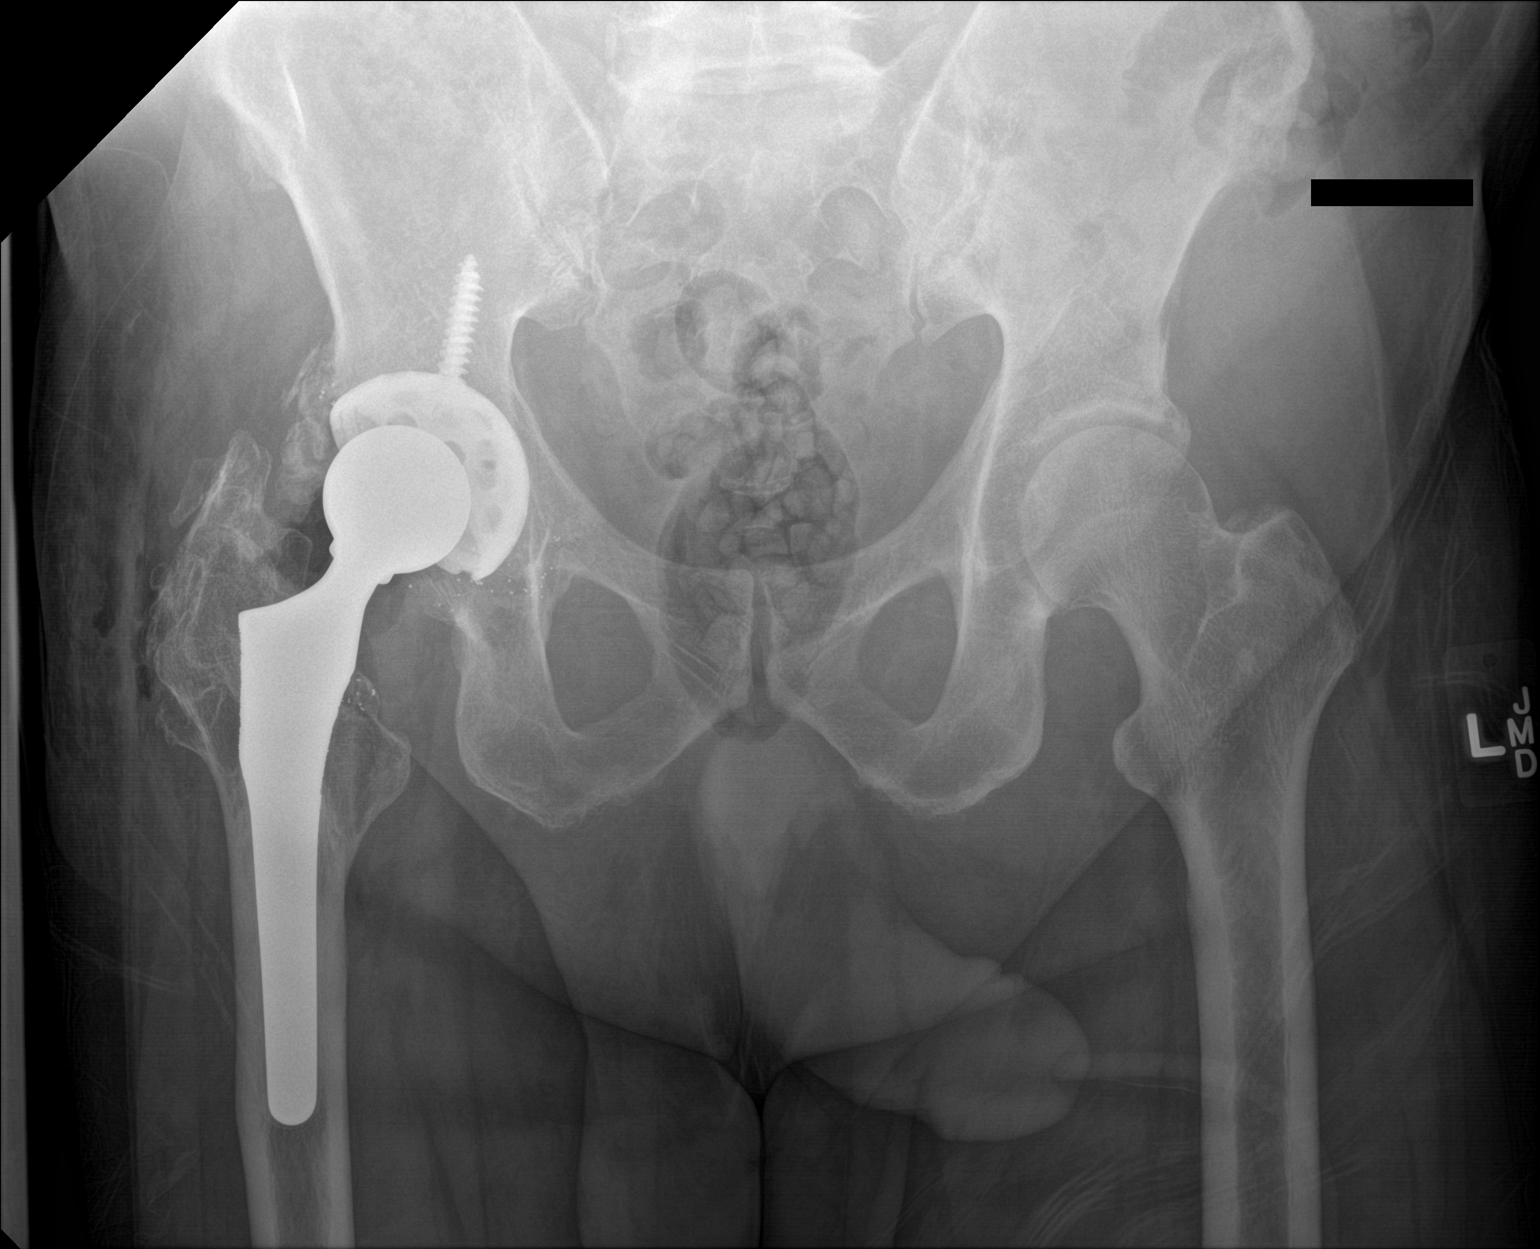

[1 of 1 positions shown; findings below may reference images not displayed]

FINDINGS: The right acetabular and femoral components appear to be well
situated. Expected postoperative changes are seen in the surrounding
soft tissues.
IMPRESSION: Status post right total hip arthroplasty.
